# Patient Record
Sex: Male | Born: 1960 | Race: White | Hispanic: No | Marital: Single | Smoking: Former smoker
Health system: Southern US, Community
[De-identification: ages and names within clinical notes are randomized; demographics above are authoritative.]

## PROBLEM LIST (undated history)

## (undated) DIAGNOSIS — F1011 Alcohol abuse, in remission: Secondary | ICD-10-CM

## (undated) DIAGNOSIS — R0602 Shortness of breath: Secondary | ICD-10-CM

## (undated) DIAGNOSIS — K219 Gastro-esophageal reflux disease without esophagitis: Secondary | ICD-10-CM

## (undated) DIAGNOSIS — J90 Pleural effusion, not elsewhere classified: Secondary | ICD-10-CM

## (undated) DIAGNOSIS — R569 Unspecified convulsions: Secondary | ICD-10-CM

## (undated) DIAGNOSIS — K08409 Partial loss of teeth, unspecified cause, unspecified class: Secondary | ICD-10-CM

## (undated) DIAGNOSIS — A491 Streptococcal infection, unspecified site: Secondary | ICD-10-CM

## (undated) DIAGNOSIS — K2970 Gastritis, unspecified, without bleeding: Secondary | ICD-10-CM

## (undated) DIAGNOSIS — R042 Hemoptysis: Secondary | ICD-10-CM

## (undated) DIAGNOSIS — Z9889 Other specified postprocedural states: Secondary | ICD-10-CM

## (undated) DIAGNOSIS — N200 Calculus of kidney: Secondary | ICD-10-CM

## (undated) HISTORY — DX: Other specified postprocedural states: Z98.890

## (undated) HISTORY — DX: Pleural effusion, not elsewhere classified: J90

## (undated) HISTORY — DX: Hemoptysis: R04.2

## (undated) HISTORY — DX: Gastritis, unspecified, without bleeding: K29.70

## (undated) HISTORY — PX: TONSILLECTOMY: SUR1361

---

## 2007-09-09 DIAGNOSIS — K2970 Gastritis, unspecified, without bleeding: Secondary | ICD-10-CM

## 2007-09-09 HISTORY — DX: Gastritis, unspecified, without bleeding: K29.70

## 2013-02-21 ENCOUNTER — Emergency Department (HOSPITAL_COMMUNITY): Payer: Self-pay

## 2013-02-21 ENCOUNTER — Encounter (HOSPITAL_COMMUNITY): Payer: Self-pay | Admitting: *Deleted

## 2013-02-21 ENCOUNTER — Emergency Department (HOSPITAL_COMMUNITY)
Admission: EM | Admit: 2013-02-21 | Discharge: 2013-02-21 | Disposition: A | Discharge status: Home / Self Care | Payer: Self-pay | Attending: Emergency Medicine | Admitting: Emergency Medicine

## 2013-02-21 DIAGNOSIS — M542 Cervicalgia: Secondary | ICD-10-CM | POA: Insufficient documentation

## 2013-02-21 DIAGNOSIS — R05 Cough: Secondary | ICD-10-CM | POA: Insufficient documentation

## 2013-02-21 DIAGNOSIS — R109 Unspecified abdominal pain: Secondary | ICD-10-CM | POA: Insufficient documentation

## 2013-02-21 DIAGNOSIS — Z87891 Personal history of nicotine dependence: Secondary | ICD-10-CM | POA: Insufficient documentation

## 2013-02-21 DIAGNOSIS — J90 Pleural effusion, not elsewhere classified: Secondary | ICD-10-CM | POA: Insufficient documentation

## 2013-02-21 DIAGNOSIS — R059 Cough, unspecified: Secondary | ICD-10-CM | POA: Insufficient documentation

## 2013-02-21 DIAGNOSIS — R079 Chest pain, unspecified: Secondary | ICD-10-CM | POA: Insufficient documentation

## 2013-02-21 DIAGNOSIS — R319 Hematuria, unspecified: Secondary | ICD-10-CM | POA: Insufficient documentation

## 2013-02-21 LAB — TROPONIN I: Troponin I: <0.3 ng/mL (ref <0.30)

## 2013-02-21 LAB — URINALYSIS, ROUTINE W REFLEX MICROSCOPIC
Leukocytes, UA: NEGATIVE
Nitrite: NEGATIVE
pH: 6 (ref 5.0–8.0)

## 2013-02-21 LAB — COMPREHENSIVE METABOLIC PANEL
Alkaline Phosphatase: 90 U/L (ref 39–117)
BUN: 21 mg/dL (ref 6–23)
Calcium: 9.5 mg/dL (ref 8.4–10.5)
Creatinine, Ser: 0.84 mg/dL (ref 0.50–1.35)
GFR calc Af Amer: >90 mL/min (ref >90)
Glucose, Bld: 112 mg/dL — ABNORMAL HIGH (ref 70–99)
Potassium: 5.4 mEq/L — ABNORMAL HIGH (ref 3.5–5.1)
Total Protein: 6.9 g/dL (ref 6.0–8.3)

## 2013-02-21 LAB — CBC
HCT: 31.3 % — ABNORMAL LOW (ref 39.0–52.0)
Hemoglobin: 10.3 g/dL — ABNORMAL LOW (ref 13.0–17.0)
MCH: 28.3 pg (ref 26.0–34.0)
MCHC: 32.9 g/dL (ref 30.0–36.0)
MCV: 86 fL (ref 78.0–100.0)

## 2013-02-21 MED ORDER — LEVOFLOXACIN 500 MG PO TABS: 500.0000 mg | ORAL_TABLET | Freq: Every day | ORAL | Status: DC

## 2013-02-21 MED ORDER — ONDANSETRON HCL 4 MG/2ML IJ SOLN: 4.0000 mg | Freq: Once | INTRAMUSCULAR | Status: DC

## 2013-02-21 MED ORDER — HYDROCODONE-ACETAMINOPHEN 5-325 MG PO TABS
1.0000 | ORAL_TABLET | Freq: Four times a day (QID) | ORAL | Status: DC | PRN
Start: 2013-02-21 — End: 2013-02-28

## 2013-02-21 MED ORDER — IOHEXOL 350 MG/ML SOLN
100.0000 mL | Freq: Once | INTRAVENOUS | Status: AC | PRN
  Administered 2013-02-21: 100 mL via INTRAVENOUS

## 2013-02-21 MED ORDER — IOHEXOL 300 MG/ML  SOLN
50.0000 mL | Freq: Once | INTRAMUSCULAR | Status: AC | PRN
  Administered 2013-02-21: 50 mL via ORAL

## 2013-02-21 MED ORDER — SODIUM CHLORIDE 0.9 % IV BOLUS (SEPSIS)
250.0000 mL | Freq: Once | INTRAVENOUS | Status: AC
  Administered 2013-02-21: 250 mL via INTRAVENOUS

## 2013-02-21 MED ORDER — SODIUM CHLORIDE 0.9 % IV SOLN: INTRAVENOUS | Status: DC

## 2013-02-21 NOTE — ED Notes (Signed)
Left neck pain, radiating to left lower abdomen. Fatigue x 2 weeks.

## 2013-02-21 NOTE — ED Provider Notes (Signed)
History     This chart was scribed for Shelda Jakes, MD, MD by Smitty Pluck, ED Scribe. The patient was seen in room APA03/[facility]03 and the patient's care was started at 1:19 PM.    CSN: [id number]  Arrival date & time 02/21/13  1055      Chief Complaint  Patient presents with  . Neck Pain  . Abdominal Pain  . Fatigue    Patient is a 52 y.o. male presenting with abdominal pain. The history is provided by the patient and medical records. No language interpreter was used.  Abdominal Pain This is a new problem. The current episode started more than 1 week ago. The problem occurs constantly. The problem has not changed since onset.Associated symptoms include abdominal pain. Pertinent negatives include no shortness of breath. The symptoms are aggravated by bending. Nothing relieves the symptoms. He has tried acetaminophen for the symptoms. The treatment provided mild relief.   HPI Comments: Alexander Gray is a 52 y.o. male who presents to the Emergency Department complaining of left lateral chest pain radiating to left abdomen and left alteral neck onset 2 weeks ago. Pt states pain is 7/10 currently and at worse 10/10. He mentions that movement and cough aggravate the pain. He mentions having hematuria once at onset of symptoms. Pt states he has taken tylenol with minor relief. Pt denies fever, chills, nausea, vomiting, diarrhea, weakness, cough, SOB and any other pain.   History reviewed. No pertinent past medical history.  History reviewed. No pertinent past surgical history.  No family history on file.  History  Substance Use Topics  . Smoking status: Former Games developer  . Smokeless tobacco: Not on file  . Alcohol Use: No      Review of Systems  Constitutional: Negative for chills.  HENT: Positive for neck pain. Negative for congestion and rhinorrhea.   Eyes: Negative for visual disturbance.  Respiratory: Positive for cough. Negative for shortness of breath.    Gastrointestinal: Positive for abdominal pain. Negative for nausea, vomiting and diarrhea.  Genitourinary: Positive for hematuria. Negative for dysuria.  Musculoskeletal: Negative for back pain.  Skin: Negative for rash.  Neurological: Negative for syncope.  Hematological: Does not bruise/bleed easily.  Psychiatric/Behavioral: Negative for confusion.  All other systems reviewed and are negative.    Allergies  Review of patient's allergies indicates no known allergies.  Home Medications   Current Outpatient Rx  Name  Route  Sig  Dispense  Refill  . Multiple Vitamin (MULTIVITAMIN WITH MINERALS) TABS   Oral   Take 1 tablet by mouth daily.         . naproxen sodium (ALEVE) 220 MG tablet   Oral   Take 220 mg by mouth 2 (two) times daily as needed (Pain).         Marland Kitchen HYDROcodone-acetaminophen (NORCO/VICODIN) 5-325 MG per tablet   Oral   Take 1-2 tablets by mouth every 6 (six) hours as needed for pain.   20 tablet   0   . levofloxacin (LEVAQUIN) 500 MG tablet   Oral   Take 1 tablet (500 mg total) by mouth daily.   7 tablet   0     BP 127/65  Pulse 93  Temp(Src) 97.6 F (36.4 C) (Oral)  Resp 16  Ht 5\' 10"  (1.778 m)  Wt 190 lb (86.183 kg)  BMI 27.26 kg/m2  SpO2 98%  Physical Exam  Nursing note and vitals reviewed. Constitutional: He is oriented to person, place, and time. He appears  well-developed and well-nourished. No distress.  HENT:  Head: Normocephalic and atraumatic.  Eyes: EOM are normal. Pupils are equal, round, and reactive to light.  Neck: Normal range of motion. Neck supple. No tracheal deviation present.  Cardiovascular: Normal rate, regular rhythm and normal heart sounds.   No murmur heard. Pulses:      Radial pulses are 2+ on the right side.  Pulmonary/Chest: Effort normal and breath sounds normal. No respiratory distress. He has no wheezes. He has no rales. He exhibits no tenderness.  Abdominal: Soft. Bowel sounds are normal. He exhibits no  distension. There is tenderness (LUQ at rib margin). There is no rebound and no guarding.  Musculoskeletal: Normal range of motion.  Neurological: He is alert and oriented to person, place, and time. No cranial nerve deficit.  Skin: Skin is warm and dry.  Psychiatric: He has a normal mood and affect. His behavior is normal.    ED Course  Procedures (including critical care time) DIAGNOSTIC STUDIES: Oxygen Saturation is 98% on room air, normal by my interpretation.    COORDINATION OF CARE: 1:28 PM Discussed ED treatment with pt and pt agrees Chest xray and labs Medications  ondansetron (ZOFRAN) injection 4 mg (not administered)  0.9 %  sodium chloride infusion ( Intravenous Rate/Dose Change 02/21/13 1421)  sodium chloride 0.9 % bolus 250 mL (250 mLs Intravenous New Bag/Given 02/21/13 1421)  iohexol (OMNIPAQUE) 300 MG/ML solution 50 mL (50 mLs Oral Contrast Given 02/21/13 1407)  iohexol (OMNIPAQUE) 350 MG/ML injection 100 mL (100 mLs Intravenous Contrast Given 02/21/13 1436)      Labs Reviewed  CBC - Abnormal; Notable for the following:    WBC 14.4 (*)    RBC 3.64 (*)    Hemoglobin 10.3 (*)    HCT 31.3 (*)    Platelets 478 (*)    All other components within normal limits  COMPREHENSIVE METABOLIC PANEL - Abnormal; Notable for the following:    Potassium 5.4 (*)    Glucose, Bld 112 (*)    Albumin 2.7 (*)    Total Bilirubin 0.2 (*)    All other components within normal limits  URINALYSIS, ROUTINE W REFLEX MICROSCOPIC - Abnormal; Notable for the following:    Ketones, ur TRACE (*)    All other components within normal limits  LIPASE, BLOOD - Abnormal; Notable for the following:    Lipase 10 (*)    All other components within normal limits  D-DIMER, QUANTITATIVE - Abnormal; Notable for the following:    D-Dimer, Quant 2.92 (*)    All other components within normal limits  TROPONIN I   Ct Angio Chest W/cm &/or Wo Cm  02/21/2013   *RADIOLOGY REPORT*  Clinical Data:  Chest pain.   Abdominal pain.  CT ANGIOGRAPHY CHEST WITH CONTRAST  Technique:  Multidetector CT imaging of the chest was performed using the standard protocol during bolus administration of intravenous contrast.  Multiplanar CT image reconstructions including MIPs were obtained to evaluate the vascular anatomy.  Contrast: 50mL OMNIPAQUE IOHEXOL 300 MG/ML  SOLN, OMNIPAQUE IOHEXOL 350 MG/ML SOLN  Comparison:   None.  Findings:  There are no filling defects in the pulmonary arterial tree to suggest acute pulmonary thromboembolism.  There is a large loculated left pleural effusion causing compressive atelectasis have nearly the entire left lung.  The parietal pleura surrounding the left pleural effusion is slightly thickened and enhances posteriorly compatible with complicated pleural fluid.  There is shift of the mediastinum to the right secondary  to mass effect from the loculated pleural effusion.  11 mm short axis diameter right paratracheal node on image 20. Small prevascular nodes.  11 mm short axis left hilar node adjacent to the left mainstem bronchus and inferior on image 47. Other smaller mediastinal nodes are noted.  No obvious endobronchial lesion in the left mainstem bronchus. There is narrowing of the tertiary segment bronchus to the lateral basal segment of the left lower lobe on image 47.  The anterior basal segment tertiary bronchus is difficult to visualize and may be occluded.  There are two areas of ground-glass at the right apex which are nonspecific.  They measure 9 and 10 mm.  Chronic right-sided rib deformity with healing.  No definite acute rib fractures.  No vertebral compression deformity.  Review of the MIP images confirms the above findings.  IMPRESSION: No evidence of acute pulmonary thromboembolism.  Large complicated left pleural effusion exerting mass effect, mediastinum and shift to the right.  Considerations include an inflammatory process such as empyema or malignant pleural effusion.  Left  lower lobe tertiary segment areas are abnormal with areas of narrowing and/or occlusion.  Underlying endobronchial lesion or mucous plugging cannot be excluded.  Bronchoscopy may be helpful.  Abnormal mediastinal adenopathy.  Given the other findings, malignancy or inflammatory process are not excluded.  Nonspecific ground-glass at the right lung apex. Adenocarcinoma cannot be excluded.  Followup by CT is recommended in 12 months, with continued annual surveillance for a minimum of 3 years.  These recommendations are taken from "Recommendations for the Management of Subsolid Pulmonary Nodules Detected at CT:  A Statement from the Fleischner Society" Radiology 2013; 266:1, 304- 317.  CT ABDOMEN AND PELVIS WITH CONTRAST  Technique:  Multidetector CT imaging of the abdomen and pelvis was performed using the standard protocol following bolus administration of intravenous contrast.  Contrast: 50mL OMNIPAQUE IOHEXOL 300 MG/ML  SOLN, OMNIPAQUE IOHEXOL 350 MG/ML SOLN  Comparison:   None.  Findings:  The gallbladder is decompressed.  The liver, spleen, pancreas, adrenal glands, and kidneys are within normal limits.  The larger locular left pleural effusion exerts mass effect upon the hemidiaphragm causing downward displacement.  This could be contributing to left upper quadrant pain.  Moderate stool burden throughout the colon without evidence of obstruction or focal wall thickening.  Normal appendix.  Bladder and prostate are within normal limits.  Mild atherosclerotic changes of the aorta.  No destructive bony process.  IMPRESSION: No acute intra-abdominal pathology.  The large loculated left pleural effusion exerts mass effect upon the left hemidiaphragm with downward displacement.  This could be contributing to left upper quadrant pain.   Original Report Authenticated By: Jolaine Click, M.D.   Ct Abdomen Pelvis W Contrast  02/21/2013   *RADIOLOGY REPORT*  Clinical Data:  Chest pain.  Abdominal pain.  CT  ANGIOGRAPHY CHEST WITH CONTRAST  Technique:  Multidetector CT imaging of the chest was performed using the standard protocol during bolus administration of intravenous contrast.  Multiplanar CT image reconstructions including MIPs were obtained to evaluate the vascular anatomy.  Contrast: 50mL OMNIPAQUE IOHEXOL 300 MG/ML  SOLN, OMNIPAQUE IOHEXOL 350 MG/ML SOLN  Comparison:   None.  Findings:  There are no filling defects in the pulmonary arterial tree to suggest acute pulmonary thromboembolism.  There is a large loculated left pleural effusion causing compressive atelectasis have nearly the entire left lung.  The parietal pleura surrounding the left pleural effusion is slightly thickened and enhances posteriorly compatible with complicated  pleural fluid.  There is shift of the mediastinum to the right secondary to mass effect from the loculated pleural effusion.  11 mm short axis diameter right paratracheal node on image 20. Small prevascular nodes.  11 mm short axis left hilar node adjacent to the left mainstem bronchus and inferior on image 47. Other smaller mediastinal nodes are noted.  No obvious endobronchial lesion in the left mainstem bronchus. There is narrowing of the tertiary segment bronchus to the lateral basal segment of the left lower lobe on image 47.  The anterior basal segment tertiary bronchus is difficult to visualize and may be occluded.  There are two areas of ground-glass at the right apex which are nonspecific.  They measure 9 and 10 mm.  Chronic right-sided rib deformity with healing.  No definite acute rib fractures.  No vertebral compression deformity.  Review of the MIP images confirms the above findings.  IMPRESSION: No evidence of acute pulmonary thromboembolism.  Large complicated left pleural effusion exerting mass effect, mediastinum and shift to the right.  Considerations include an inflammatory process such as empyema or malignant pleural effusion.  Left lower lobe tertiary  segment areas are abnormal with areas of narrowing and/or occlusion.  Underlying endobronchial lesion or mucous plugging cannot be excluded.  Bronchoscopy may be helpful.  Abnormal mediastinal adenopathy.  Given the other findings, malignancy or inflammatory process are not excluded.  Nonspecific ground-glass at the right lung apex. Adenocarcinoma cannot be excluded.  Followup by CT is recommended in 12 months, with continued annual surveillance for a minimum of 3 years.  These recommendations are taken from "Recommendations for the Management of Subsolid Pulmonary Nodules Detected at CT:  A Statement from the Fleischner Society" Radiology 2013; 266:1, 304- 317.  CT ABDOMEN AND PELVIS WITH CONTRAST  Technique:  Multidetector CT imaging of the abdomen and pelvis was performed using the standard protocol following bolus administration of intravenous contrast.  Contrast: 50mL OMNIPAQUE IOHEXOL 300 MG/ML  SOLN, OMNIPAQUE IOHEXOL 350 MG/ML SOLN  Comparison:   None.  Findings:  The gallbladder is decompressed.  The liver, spleen, pancreas, adrenal glands, and kidneys are within normal limits.  The larger locular left pleural effusion exerts mass effect upon the hemidiaphragm causing downward displacement.  This could be contributing to left upper quadrant pain.  Moderate stool burden throughout the colon without evidence of obstruction or focal wall thickening.  Normal appendix.  Bladder and prostate are within normal limits.  Mild atherosclerotic changes of the aorta.  No destructive bony process.  IMPRESSION: No acute intra-abdominal pathology.  The large loculated left pleural effusion exerts mass effect upon the left hemidiaphragm with downward displacement.  This could be contributing to left upper quadrant pain.   Original Report Authenticated By: Jolaine Click, M.D.    Date: 02/21/2013  Rate: 87  Rhythm: normal sinus rhythm  QRS Axis: normal  Intervals: normal  ST/T Wave abnormalities: normal   Conduction Disutrbances:none  Narrative Interpretation:   Old EKG Reviewed: none available  Results for orders placed during the hospital encounter of 02/21/13  CBC      Result Value Range   WBC 14.4 (*) 4.0 - 10.5 K/uL   RBC 3.64 (*) 4.22 - 5.81 MIL/uL   Hemoglobin 10.3 (*) 13.0 - 17.0 g/dL   HCT 54.0 (*) 98.1 - 19.1 %   MCV 86.0  78.0 - 100.0 fL   MCH 28.3  26.0 - 34.0 pg   MCHC 32.9  30.0 - 36.0 g/dL  RDW 14.2  11.5 - 15.5 %   Platelets 478 (*) 150 - 400 K/uL  COMPREHENSIVE METABOLIC PANEL      Result Value Range   Sodium 140  135 - 145 mEq/L   Potassium 5.4 (*) 3.5 - 5.1 mEq/L   Chloride 103  96 - 112 mEq/L   CO2 28  19 - 32 mEq/L   Glucose, Bld 112 (*) 70 - 99 mg/dL   BUN 21  6 - 23 mg/dL   Creatinine, Ser 4.09  0.50 - 1.35 mg/dL   Calcium 9.5  8.4 - 81.1 mg/dL   Total Protein 6.9  6.0 - 8.3 g/dL   Albumin 2.7 (*) 3.5 - 5.2 g/dL   AST 19  0 - 37 U/L   ALT 16  0 - 53 U/L   Alkaline Phosphatase 90  39 - 117 U/L   Total Bilirubin 0.2 (*) 0.3 - 1.2 mg/dL   GFR calc non Af Amer >90  >90 mL/min   GFR calc Af Amer >90  >90 mL/min  URINALYSIS, ROUTINE W REFLEX MICROSCOPIC      Result Value Range   Color, Urine YELLOW  YELLOW   APPearance CLEAR  CLEAR   Specific Gravity, Urine 1.025  1.005 - 1.030   pH 6.0  5.0 - 8.0   Glucose, UA NEGATIVE  NEGATIVE mg/dL   Hgb urine dipstick NEGATIVE  NEGATIVE   Bilirubin Urine NEGATIVE  NEGATIVE   Ketones, ur TRACE (*) NEGATIVE mg/dL   Protein, ur NEGATIVE  NEGATIVE mg/dL   Urobilinogen, UA 0.2  0.0 - 1.0 mg/dL   Nitrite NEGATIVE  NEGATIVE   Leukocytes, UA NEGATIVE  NEGATIVE  LIPASE, BLOOD      Result Value Range   Lipase 10 (*) 11 - 59 U/L  D-DIMER, QUANTITATIVE      Result Value Range   D-Dimer, Quant 2.92 (*) 0.00 - 0.48 ug/mL-FEU  TROPONIN I      Result Value Range   Troponin I <0.30  <0.30 ng/mL      1. Pleural effusion, left       MDM  Symptoms most likely associated with a large left pleural effusion. This could  be malignant in nature could be inflammatory. Patient will need followup with pulmonary medicine is currently from out of town but he would like to the followup here. He'll be in the area for a while no evidence of pulmonary embolism. No evidence of acute cardiac event. Patient will be treated with Levaquin he has not been hospital recently so treat him as if this could be a community-acquired pneumonia he will need to have this tapped and the fluid analyzed and further workup. Patient understands that.      I personally performed the services described in this documentation, which was scribed in my presence. The recorded information has been reviewed and is accurate.     Shelda Jakes, MD 02/21/13 [phone]

## 2013-02-22 ENCOUNTER — Other Ambulatory Visit (HOSPITAL_COMMUNITY): Payer: Self-pay | Admitting: Pulmonary Disease

## 2013-02-23 ENCOUNTER — Ambulatory Visit (HOSPITAL_COMMUNITY)
Admission: RE | Admit: 2013-02-23 | Discharge: 2013-02-23 | Disposition: A | Discharge status: Home / Self Care | Payer: Self-pay | Source: Ambulatory Visit | Attending: Pulmonary Disease | Admitting: Pulmonary Disease

## 2013-02-23 ENCOUNTER — Other Ambulatory Visit (HOSPITAL_COMMUNITY): Payer: Self-pay | Admitting: Pulmonary Disease

## 2013-02-23 DIAGNOSIS — J90 Pleural effusion, not elsewhere classified: Secondary | ICD-10-CM | POA: Insufficient documentation

## 2013-02-23 DIAGNOSIS — Z9889 Other specified postprocedural states: Secondary | ICD-10-CM

## 2013-02-23 DIAGNOSIS — R0602 Shortness of breath: Secondary | ICD-10-CM | POA: Insufficient documentation

## 2013-02-23 HISTORY — DX: Other specified postprocedural states: Z98.890

## 2013-02-23 LAB — BODY FLUID CELL COUNT WITH DIFFERENTIAL
Eos, Fluid: 0 %
Monocyte-Macrophage-Serous Fluid: 1 % — ABNORMAL LOW (ref 50–90)
Neutrophil Count, Fluid: 97 % — ABNORMAL HIGH (ref 0–25)
Total Nucleated Cell Count, Fluid: 77339 cu mm — ABNORMAL HIGH (ref 0–1000)

## 2013-02-23 LAB — PROTEIN, BODY FLUID

## 2013-02-23 NOTE — Progress Notes (Signed)
Lidocaine 1%           6mL injected                        pleural fluid removed

## 2013-02-23 NOTE — Procedures (Signed)
PreOperative Dx: LEFT pleural effusion Postoperative Dx: LEFT pleural effusion Procedure:   US guided LEFT thoracentesis Radiologist:  Tyron Russell Anesthesia:  6.5 ml of 1% lidocaine Specimen:  1550 ml of serosanguinous fluid EBL:   < 1 ml Complications: None

## 2013-02-27 ENCOUNTER — Telehealth (HOSPITAL_COMMUNITY): Payer: Self-pay | Admitting: Emergency Medicine

## 2013-02-27 LAB — BODY FLUID CULTURE

## 2013-02-27 NOTE — ED Notes (Signed)
Chart returned from EDP office. Per Joni Reining Pisciotta PA-C, c/w Levaquin. Return to ED ASAP if any new or worsening symptom or if symptoms persist > 5 days.

## 2013-02-28 ENCOUNTER — Institutional Professional Consult (permissible substitution) (INDEPENDENT_AMBULATORY_CARE_PROVIDER_SITE_OTHER): Payer: Self-pay | Admitting: Cardiothoracic Surgery

## 2013-02-28 ENCOUNTER — Encounter (HOSPITAL_COMMUNITY): Payer: Self-pay | Admitting: Pharmacy Technician

## 2013-02-28 ENCOUNTER — Encounter (HOSPITAL_COMMUNITY): Payer: Self-pay | Admitting: *Deleted

## 2013-02-28 ENCOUNTER — Other Ambulatory Visit: Payer: Self-pay | Admitting: *Deleted

## 2013-02-28 ENCOUNTER — Encounter: Payer: Self-pay | Admitting: *Deleted

## 2013-02-28 VITALS — BP 121/71 | HR 98 | Temp 97.3°F | Resp 16 | Ht 70.0 in | Wt 203.0 lb

## 2013-02-28 DIAGNOSIS — J869 Pyothorax without fistula: Secondary | ICD-10-CM

## 2013-02-28 DIAGNOSIS — Z9889 Other specified postprocedural states: Secondary | ICD-10-CM | POA: Insufficient documentation

## 2013-02-28 DIAGNOSIS — J90 Pleural effusion, not elsewhere classified: Secondary | ICD-10-CM

## 2013-02-28 DIAGNOSIS — K2970 Gastritis, unspecified, without bleeding: Secondary | ICD-10-CM | POA: Insufficient documentation

## 2013-02-28 NOTE — Patient Instructions (Signed)
Thoracoscopy Thoracoscopy is a procedure in which a thin, lighted tube (thoracoscope) is put through a small cut (incision) in the chest wall. This procedure makes it possible for your caregiver to look at the lungs or other structures in the chest cavity and to do some minor operations. This is a more minor procedure than thoracotomy, which opens the chest cavity with a large incision. Thoracoscopy can sometimes be used instead of thoracotomy. Thoracoscopy usually involves less pain, a shorter hospital stay, and a shorter recovery time. Common reasons for this procedure are:  To study diseases or problems in the chest.  To take a tissue sample (biopsy) to study under a microscope.  To put medicines directly into the lungs.  To remove collections of fluid, pus (empyema), or blood in the chest. LET YOUR CAREGIVER KNOW ABOUT:   Allergies to food or medicine.  Medicines taken, including vitamins, herbs, eyedrops, over-the-counter medicines, and creams.  Use of steroids (by mouth or creams).  Previous problems with anesthetics or numbing medicines.  History of bleeding problems or blood clots.  Previous surgery.  Any history of heart problems.  Other health problems, including diabetes and kidney problems.  Possibility of pregnancy, if this applies. RISKS AND COMPLICATIONS   If too much bleeding occurs, or if the surgery turns out to be major, it may be necessary to open the chest (thoracotomy) to control the bleeding.  There is a risk of injury to nerves or other structures in the chest as a result of the placement of the instruments.  When the chest tube is removed, the lung may collapse (pneumothorax). If this happens, the tube may need to be reinserted and left in place until a time when the lung will remain expanded as the tube is removed. BEFORE THE PROCEDURE   You may have routine tests done, such as blood tests, urine tests, and chest X-rays.  Electrocardiography (EKG) to  record the electrical activity of the heart may be done to make sure the heart is okay.  Do not eat or drink after midnight the night before the procedure. Medicine given before the procedure that makes you sleep (general anesthetic) may cause vomiting. A patient who vomits is in danger of inhaling food into the lungs. This can cause serious complications and can be life-threatening. PROCEDURE  Video-assisted thoracic surgery (VATS) is surgery using a thoracoscope with a small video camera on the end. The picture from inside the chest is displayed on a television screen for the surgeon to see. The lung being worked on is collapsed and several small incisions are made to insert instruments. The surgeon can manipulate the instruments while watching on the television screen. The thoracoscope may be removed and put into different areas as needed. When the procedure is finished, the surgeon expands the lung and puts one or more chest tubes in the chest. The chest tubes allow the lung to expand and allow fluid (drainage) to come out. The remaining incisions are closed with stitches (sutures) or staples. AFTER THE PROCEDURE   The chest tube is left in place for 1 to several days to drain fluid or air from the chest cavity.  Hospital stays range from 1 to 5 days depending on the procedure and treatment. Document Released: 05/24/2003 Document Revised: 11/17/2011 Document Reviewed: 02/12/2011 [facility] Patient Information 2014 [place], [place]. Thoracoscopy Care After Refer to this sheet in the next few weeks. These discharge instructions provide you with general information on caring for yourself after you leave the  hospital. Your caregiver may also give you specific instructions. Your treatment has been planned according to the most current medical practices available, but unavoidable complications sometimes occur. If you have any problems or questions after discharge, call your caregiver. HOME CARE  INSTRUCTIONS   Remove the bandage (dressing) over your chest tube site as directed by your caregiver.  It is normal to be sore for a couple weeks following surgery. See your caregiver if this seems to be getting worse rather than better.  Only take over-the-counter or prescription medicines for pain, discomfort, or fever as directed by your caregiver. It is very important to take pain medicine when you need it so that you will cough and breathe deeply enough to clear mucus (phlegm) and expand your lungs.  If it hurts to cough, hold a pillow against your chest when you cough. This may help with the discomfort. In spite of the discomfort, cough frequently, as this helps protect against getting an infection in your lung (pneumonia).  Taking deep breaths keeps lungs inflated and protects against pneumonia. Most patients will go home with an incentive spirometer that encourages deep breathing.  You may resume a normal diet and activities as directed.  Use showers for bathing until you see your caregiver, or as instructed.  Change dressings if necessary or as directed.  Avoid lifting or driving until you are instructed otherwise.  Make an appointment to see your caregiver for stitch (suture) or staple removal when instructed.  Do not travel by airplane for 2 weeks after the chest tube is removed. SEEK MEDICAL CARE IF:   You are bleeding from your wounds.  You have redness, swelling, or increasing pain in the wounds.  Your heartbeat feels irregular or very fast.  There is pus coming from your wounds.  There is a bad smell coming from the wound or dressing. SEEK IMMEDIATE MEDICAL CARE IF:   You have a fever.  You develop a rash.  You have difficulty breathing.  You develop any reaction or side effects to medicines given.  You develop lightheadedness or feel faint.  You develop shortness of breath or chest pain. MAKE SURE YOU:   Understand these instructions.  Will watch  your condition.  Will get help right away if you are not doing well or get worse. Document Released: 03/14/2005 Document Revised: 11/17/2011 Document Reviewed: 02/12/2011 [facility] Patient Information 2014 [place], [place].

## 2013-02-28 NOTE — Progress Notes (Signed)
Pt advised to Stop taking Aspirin, Coumadin, Plavix, Effient, and herbal medicationOmega-3 Fatty Acids (FISH OIL) 1200 MG CAPS s . Do not take any NSAIDs ie: Ibuprofen, Advil, Naproxen or any medication containing Aspirin.

## 2013-02-28 NOTE — Progress Notes (Signed)
[address]       [place] [address]             [phone]                    [name] [facility] Medical Record #[id number] Date of Birth: 1960-11-15  Referring: Fredirick Maudlin, MD Primary Care: Fredirick Maudlin, MD  Chief Complaint:    Chief Complaint  Patient presents with  . Pleural Effusion    left/empyema......Alexander Gray and treat    History of Present Illness:    Patient is a 52 year old male with previous long smoking history who recently presented at [facility] emergency room with a three-week history of "pinched nerve" involving his left neck and chest and left flank. He was taking up to 10 Aleve a day for the pain. On June 16 he went to the [facility] emergency room CT scan of the chest showed a large left complex pleural effusion. He was started on Levaquin, a thoracentesis was done draining approximately 1500 cc of fluid, Dr. Juanetta Gosling reports today that this fluid grew strep viridans. The patient denies any fever or chills. He has noted increasing weakness over the past week and increasing pedal edema. He has been a long-term smoker starting as a teenager. He quit smoking in February 2014. He notes a previous history of heavy alcohol use but none for the past year.  He's had no hemoptysis. He denies any exposure to tuberculosis that he is aware of. He denies working with asbestos.  He recently came here from [place] where he lives to visit with his parents.  Current Activity/ Functional Status:  Patient is independent with mobility/ambulation, transfers, ADL's, IADL's.  Zubrod Score: At the time of surgery this patient's most appropriate activity status/level should be described as: []  Normal activity, no symptoms [x]  Symptoms, fully ambulatory []  Symptoms, in bed less than or equal to 50% of the time []  Symptoms, in bed greater than 50% of the time but less than 100% []  Bedridden []  Moribund   Past Medical History  Diagnosis Date    . Hemoptysis     2009  . Pleural effusion, left     02/28/13  . S/P thoracentesis 02/23/13    left  . Gastritis 2009    vomiting blood    Past Surgical History  Procedure Laterality Date  . Tonsillectomy      Family history: Patient denies any family members with tuberculosis his father is alive and here in the office today with a history of hypertension hyperlipidemia his mother is also here has a history of hypertension he has 2 brothers who are healthy  History   Social History  . Marital Status: Single    Spouse Name: N/A    Number of Children: N/A  . Years of Education: N/A   Occupational History  .  currently unemployed    Social History Main Topics  . Smoking status: Former Smoker -- 1.00 packs/day for 35 years    Types: Cigarettes    Quit date: 09/30/2012  . Smokeless tobacco: Never Used     Comment: vapor for 6 months  . Alcohol Use: No  . Drug Use: Not on file  . Sexually Active: Not on file    Social History Narrative  . No narrative on file    History  Smoking status  . Former Smoker -- 1.00 packs/day for 35 years  . Types: Cigarettes  .  Quit date: 09/30/2012  Smokeless tobacco  . Never Used    Comment: vapor for 6 months    History  Alcohol Use No     No Known Allergies  Current Outpatient Prescriptions  Medication Sig Dispense Refill  . HYDROcodone-acetaminophen (NORCO/VICODIN) 5-325 MG per tablet Take 1-2 tablets by mouth every 6 (six) hours as needed for pain.  20 tablet  0  . levofloxacin (LEVAQUIN) 500 MG tablet Take 1 tablet (500 mg total) by mouth daily.  7 tablet  0  . Multiple Vitamin (MULTIVITAMIN WITH MINERALS) TABS Take 1 tablet by mouth daily.      . naproxen sodium (ALEVE) 220 MG tablet Take 220 mg by mouth 2 (two) times daily as needed (Pain).       No current facility-administered medications for this visit.       Review of Systems:     Cardiac Review of Systems: Y or N  Chest Pain [ y   ]  Resting SOB [ y   ] Exertional SOB  Cove.Etienne  ]  Orthopnea Cove.Etienne  ]   Pedal Edema Cove.Etienne   ]    Palpitations [ n ] Syncope  [ n ]   Presyncope [ n  ]  General Review of Systems: [Y] = yes [  ]=no Constitional: recent weight change [ y ]; anorexia [  ]; fatigue [  ]; nausea [  ]; night sweats [n  ]; fever [ n ]; or chills [ n ];                                                                                                                                          Dental: poor dentition[ y ]; Last Dentist visit:   Eye : blurred vision [  ]; diplopia [   ]; vision changes [  ];  Amaurosis fugax[  ]; Resp: cough [  ];  wheezing[  ];  hemoptysis[n  ]; shortness of breath[y  ]; paroxysmal nocturnal dyspnea[y  ]; dyspnea on exertion[ y ]; or orthopnea[y  ];  GI:  gallstones[  ], vomiting[ n ];  dysphagia[  ]; melena[  ];  hematochezia [  ]; heartburn[  ];   Hx of  Colonoscopy[  ]; GU: kidney stones [  ]; hematuria[  ];   dysuria Cove.Etienne  ];  nocturia[  ];  history of     obstruction [  ]; urinary frequency Cove.Etienne  ]             Skin: rash, swelling[  ];, hair loss[  ];  peripheral edema[  ];  or itching[  ]; Musculosketetal: myalgias[  ];  joint swelling[  ];  joint erythema[  ];  joint pain[  ];  back pain[  ];  Heme/Lymph: bruising[  ];  bleeding[  ];  anemia[  ];  Neuro: TIA[  ];  headaches[  ];  stroke[  ];  vertigo[  ];  seizures[  ];   paresthesias[  ];  difficulty walking[  ];  Psych:depression[  ]; anxiety[ y ];  Endocrine: diabetes[n  ];  thyroid dysfunction[n  ];  Immunizations: Flu Milo.Brash  ]; Pneumococcal[  n];  Other:  Physical Exam: BP 121/71  Pulse 98  Temp(Src) 97.3 F (36.3 C) (Oral)  Resp 16  Ht 5\' 10"  (1.778 m)  Wt 203 lb (92.08 kg)  BMI 29.13 kg/m2  SpO2 98%  General appearance: alert, cooperative, appears stated age and no distress Neurologic: intact Heart: regular rate and rhythm, S1, S2 normal, no murmur, click, rub or gallop and normal apical impulse Lungs: diminished breath sounds LLL and LUL Abdomen: soft,  non-tender; bowel sounds normal; no masses,  no organomegaly Extremities: edema bilaterial ankle edema and Homans sign is negative, no sign of DVT no carotid bruits   Diagnostic Studies & Laboratory data:     Recent Radiology Findings:  Dg Chest 1 View  02/23/2013   *RADIOLOGY REPORT*  Clinical Data: Left pleural effusion post thoracentesis with removal of 1550 ml of serosanguineous fluid from the left hemithorax  CHEST - 1 VIEW  Comparison: Chest CT 02/21/2013  Findings: Minimal mediastinal shift left to right. Persistent moderate sized left pleural effusion despite removal of greater than 1.5 liters of fluid from the left chest. Persistent atelectasis of left lower lobe. Right lung clear. No pneumothorax. No acute osseous findings. Old healed fracture posterolateral right 6th rib.  IMPRESSION: No pneumothorax following left thoracentesis. Persistent moderate left pleural effusion and significant left lower lobe atelectasis despite removal of 1550 ml of serosanguineous fluid from the left chest.  No shortness of breath following thoracentesis. Patient states he feels symptomatically improved. Discussed with the patient the need for deep breathing and coughing to help re-expand the atelectatic left lower lobe.   Original Report Authenticated By: Ulyses Southward, M.D.   Ct Angio Chest W/cm &/or Wo Cm  02/21/2013   *RADIOLOGY REPORT*  Clinical Data:  Chest pain.  Abdominal pain.  CT ANGIOGRAPHY CHEST WITH CONTRAST  Technique:  Multidetector CT imaging of the chest was performed using the standard protocol during bolus administration of intravenous contrast.  Multiplanar CT image reconstructions including MIPs were obtained to evaluate the vascular anatomy.  Contrast: 50mL OMNIPAQUE IOHEXOL 300 MG/ML  SOLN, OMNIPAQUE IOHEXOL 350 MG/ML SOLN  Comparison:   None.  Findings:  There are no filling defects in the pulmonary arterial tree to suggest acute pulmonary thromboembolism.  There is a large loculated left  pleural effusion causing compressive atelectasis have nearly the entire left lung.  The parietal pleura surrounding the left pleural effusion is slightly thickened and enhances posteriorly compatible with complicated pleural fluid.  There is shift of the mediastinum to the right secondary to mass effect from the loculated pleural effusion.  11 mm short axis diameter right paratracheal node on image 20. Small prevascular nodes.  11 mm short axis left hilar node adjacent to the left mainstem bronchus and inferior on image 47. Other smaller mediastinal nodes are noted.  No obvious endobronchial lesion in the left mainstem bronchus. There is narrowing of the tertiary segment bronchus to the lateral basal segment of the left lower lobe on image 47.  The anterior basal segment tertiary bronchus is difficult to visualize and may be occluded.  There are two areas of ground-glass at the right apex which are nonspecific.  They measure 9 and 10 mm.  Chronic right-sided rib deformity with healing.  No definite acute rib fractures.  No vertebral compression deformity.  Review of the MIP images confirms the above findings.  IMPRESSION: No evidence of acute pulmonary thromboembolism.  Large complicated left pleural effusion exerting mass effect, mediastinum and shift to the right.  Considerations include an inflammatory process such as empyema or malignant pleural effusion.  Left lower lobe tertiary segment areas are abnormal with areas of narrowing and/or occlusion.  Underlying endobronchial lesion or mucous plugging cannot be excluded.  Bronchoscopy may be helpful.  Abnormal mediastinal adenopathy.  Given the other findings, malignancy or inflammatory process are not excluded.  Nonspecific ground-glass at the right lung apex. Adenocarcinoma cannot be excluded.  Followup by CT is recommended in 12 months, with continued annual surveillance for a minimum of 3 years.  These recommendations are taken from "Recommendations for the  Management of Subsolid Pulmonary Nodules Detected at CT:  A Statement from the Fleischner Society" Radiology 2013; 266:1, 304- 317.  CT ABDOMEN AND PELVIS WITH CONTRAST  Technique:  Multidetector CT imaging of the abdomen and pelvis was performed using the standard protocol following bolus administration of intravenous contrast.  Contrast: 50mL OMNIPAQUE IOHEXOL 300 MG/ML  SOLN, OMNIPAQUE IOHEXOL 350 MG/ML SOLN  Comparison:   None.  Findings:  The gallbladder is decompressed.  The liver, spleen, pancreas, adrenal glands, and kidneys are within normal limits.  The larger locular left pleural effusion exerts mass effect upon the hemidiaphragm causing downward displacement.  This could be contributing to left upper quadrant pain.  Moderate stool burden throughout the colon without evidence of obstruction or focal wall thickening.  Normal appendix.  Bladder and prostate are within normal limits.  Mild atherosclerotic changes of the aorta.  No destructive bony process.  IMPRESSION: No acute intra-abdominal pathology.  The large loculated left pleural effusion exerts mass effect upon the left hemidiaphragm with downward displacement.  This could be contributing to left upper quadrant pain.   Original Report Authenticated By: Jolaine Click, M.D.   Ct Abdomen Pelvis W Contrast  02/21/2013   *RADIOLOGY REPORT*  Clinical Data:  Chest pain.  Abdominal pain.  CT ANGIOGRAPHY CHEST WITH CONTRAST  Technique:  Multidetector CT imaging of the chest was performed using the standard protocol during bolus administration of intravenous contrast.  Multiplanar CT image reconstructions including MIPs were obtained to evaluate the vascular anatomy.  Contrast: 50mL OMNIPAQUE IOHEXOL 300 MG/ML  SOLN, OMNIPAQUE IOHEXOL 350 MG/ML SOLN  Comparison:   None.  Findings:  There are no filling defects in the pulmonary arterial tree to suggest acute pulmonary thromboembolism.  There is a large loculated left pleural effusion causing  compressive atelectasis have nearly the entire left lung.  The parietal pleura surrounding the left pleural effusion is slightly thickened and enhances posteriorly compatible with complicated pleural fluid.  There is shift of the mediastinum to the right secondary to mass effect from the loculated pleural effusion.  11 mm short axis diameter right paratracheal node on image 20. Small prevascular nodes.  11 mm short axis left hilar node adjacent to the left mainstem bronchus and inferior on image 47. Other smaller mediastinal nodes are noted.  No obvious endobronchial lesion in the left mainstem bronchus. There is narrowing of the tertiary segment bronchus to the lateral basal segment of the left lower lobe on image 47.  The anterior basal segment tertiary bronchus is difficult to visualize and may be occluded.  There are two areas of ground-glass at the  right apex which are nonspecific.  They measure 9 and 10 mm.  Chronic right-sided rib deformity with healing.  No definite acute rib fractures.  No vertebral compression deformity.  Review of the MIP images confirms the above findings.  IMPRESSION: No evidence of acute pulmonary thromboembolism.  Large complicated left pleural effusion exerting mass effect, mediastinum and shift to the right.  Considerations include an inflammatory process such as empyema or malignant pleural effusion.  Left lower lobe tertiary segment areas are abnormal with areas of narrowing and/or occlusion.  Underlying endobronchial lesion or mucous plugging cannot be excluded.  Bronchoscopy may be helpful.  Abnormal mediastinal adenopathy.  Given the other findings, malignancy or inflammatory process are not excluded.  Nonspecific ground-glass at the right lung apex. Adenocarcinoma cannot be excluded.  Followup by CT is recommended in 12 months, with continued annual surveillance for a minimum of 3 years.  These recommendations are taken from "Recommendations for the Management of Subsolid  Pulmonary Nodules Detected at CT:  A Statement from the Fleischner Society" Radiology 2013; 266:1, 304- 317.  CT ABDOMEN AND PELVIS WITH CONTRAST  Technique:  Multidetector CT imaging of the abdomen and pelvis was performed using the standard protocol following bolus administration of intravenous contrast.  Contrast: 50mL OMNIPAQUE IOHEXOL 300 MG/ML  SOLN, OMNIPAQUE IOHEXOL 350 MG/ML SOLN  Comparison:   None.  Findings:  The gallbladder is decompressed.  The liver, spleen, pancreas, adrenal glands, and kidneys are within normal limits.  The larger locular left pleural effusion exerts mass effect upon the hemidiaphragm causing downward displacement.  This could be contributing to left upper quadrant pain.  Moderate stool burden throughout the colon without evidence of obstruction or focal wall thickening.  Normal appendix.  Bladder and prostate are within normal limits.  Mild atherosclerotic changes of the aorta.  No destructive bony process.  IMPRESSION: No acute intra-abdominal pathology.  The large loculated left pleural effusion exerts mass effect upon the left hemidiaphragm with downward displacement.  This could be contributing to left upper quadrant pain.   Original Report Authenticated By: Jolaine Click, M.D.   US Thoracentesis Asp Pleural Space W/img Guide  02/23/2013   *RADIOLOGY REPORT*  CLINICAL DATA: Large left pleural effusion, shortness of breath  ULTRASOUND GUIDED LEFT THORACENTESIS:  Technique: After explanation of procedure and risks, written informed consent for thoracentesis obtained. Time out protocol followed. Left pleural effusion localized by ultrasound. Site at posterior leftchest prepped and draped in usual sterile fashion. Skin and chest wall anesthetized with 6.5 ml of 1% lidocaine. Standard 8-French thoracentesis catheter placed into left pleural space. Poor fluid flow was encountered. Catheter was removed and a 5-French Yueh needle was placed into left hemithorax at the same site.  Good flow of fluid was encountered. 1550 ml of serosanguineous fluid aspirated by syringe pump. Procedure tolerated well by patient without immediate complication.  IMPRESSION: Ultrasound-guided left thoracentesis with removal of 1550 ml of left pleural fluid. Fluid sent to laboratory for requested analysis.   Original Report Authenticated By: Ulyses Southward, M.D.    Recent Lab Findings: Lab Results  Component Value Date   WBC 14.4* 02/21/2013   HGB 10.3* 02/21/2013   HCT 31.3* 02/21/2013   PLT 478* 02/21/2013   GLUCOSE 112* 02/21/2013   ALT 16 02/21/2013   AST 19 02/21/2013   NA 140 02/21/2013   K 5.4* 02/21/2013   CL 103 02/21/2013   CREATININE 0.84 02/21/2013   BUN 21 02/21/2013   CO2 28 02/21/2013   MICRO:  of pleural fluid  ABUNDANT VIRIDANS STREPTOCOCCUS no AFB on smear  PLEURAL FLUID, LEFT: REACTIVE MESOTHELIAL CELLS PRESENT. ACUTE INFLAMMATION.   Assessment / Plan:      Patient with large left loculated effusion, now growing  ABUNDANT VIRIDANS STREPTOCOCCUS. Cytology is negative for malignancy on thoracentesis. Because of the incomplete drainage a thoracentesis alone I recommended to the patient that we proceed tomorrow with bronchoscopy left video-assisted thoracoscopy and drainage of empyema. The risks and options have been discussed with the patient and his parents in detail. His questions have been answered and he is willing to proceed.    Delight Ovens MD      [address] [place] [place] [address] Office [phone]   Beeper [phone]  02/28/2013 4:29 PM

## 2013-03-01 ENCOUNTER — Inpatient Hospital Stay (HOSPITAL_COMMUNITY): Payer: Self-pay

## 2013-03-01 ENCOUNTER — Ambulatory Visit (HOSPITAL_COMMUNITY): Payer: Self-pay

## 2013-03-01 ENCOUNTER — Encounter (HOSPITAL_COMMUNITY): Payer: Self-pay | Admitting: Certified Registered"

## 2013-03-01 ENCOUNTER — Ambulatory Visit (HOSPITAL_COMMUNITY): Payer: Self-pay | Admitting: Certified Registered"

## 2013-03-01 ENCOUNTER — Inpatient Hospital Stay (HOSPITAL_COMMUNITY)
Admission: RE | Admit: 2013-03-01 | Discharge: 2013-03-07 | DRG: 165 | Disposition: A | Discharge status: Home / Self Care | Payer: MEDICAID | Source: Ambulatory Visit | Attending: Cardiothoracic Surgery | Admitting: Cardiothoracic Surgery

## 2013-03-01 ENCOUNTER — Encounter (HOSPITAL_COMMUNITY)
Admission: RE | Disposition: A | Discharge status: Home / Self Care | Payer: Self-pay | Source: Ambulatory Visit | Attending: Cardiothoracic Surgery

## 2013-03-01 ENCOUNTER — Encounter (HOSPITAL_COMMUNITY): Payer: Self-pay

## 2013-03-01 DIAGNOSIS — F411 Generalized anxiety disorder: Secondary | ICD-10-CM | POA: Diagnosis present

## 2013-03-01 DIAGNOSIS — B954 Other streptococcus as the cause of diseases classified elsewhere: Secondary | ICD-10-CM | POA: Diagnosis present

## 2013-03-01 DIAGNOSIS — J869 Pyothorax without fistula: Secondary | ICD-10-CM

## 2013-03-01 DIAGNOSIS — J438 Other emphysema: Secondary | ICD-10-CM | POA: Diagnosis present

## 2013-03-01 DIAGNOSIS — F1010 Alcohol abuse, uncomplicated: Secondary | ICD-10-CM | POA: Diagnosis present

## 2013-03-01 DIAGNOSIS — K219 Gastro-esophageal reflux disease without esophagitis: Secondary | ICD-10-CM | POA: Diagnosis present

## 2013-03-01 DIAGNOSIS — Z87891 Personal history of nicotine dependence: Secondary | ICD-10-CM

## 2013-03-01 HISTORY — PX: VIDEO BRONCHOSCOPY: SHX5072

## 2013-03-01 HISTORY — DX: Gastro-esophageal reflux disease without esophagitis: K21.9

## 2013-03-01 HISTORY — PX: VIDEO ASSISTED THORACOSCOPY (VATS)/EMPYEMA: SHX6172

## 2013-03-01 HISTORY — DX: Calculus of kidney: N20.0

## 2013-03-01 HISTORY — DX: Shortness of breath: R06.02

## 2013-03-01 HISTORY — DX: Unspecified convulsions: R56.9

## 2013-03-01 LAB — APTT: aPTT: 43 seconds — ABNORMAL HIGH (ref 24–37)

## 2013-03-01 LAB — BODY FLUID CELL COUNT WITH DIFFERENTIAL
Lymphs, Fluid: 3 %
Monocyte-Macrophage-Serous Fluid: 2 % — ABNORMAL LOW (ref 50–90)
Neutrophil Count, Fluid: 95 % — ABNORMAL HIGH (ref 0–25)
Total Nucleated Cell Count, Fluid: 22300 cu mm — ABNORMAL HIGH (ref 0–1000)

## 2013-03-01 LAB — URINALYSIS, ROUTINE W REFLEX MICROSCOPIC
Bilirubin Urine: NEGATIVE
Glucose, UA: NEGATIVE mg/dL
Hgb urine dipstick: NEGATIVE
Ketones, ur: NEGATIVE mg/dL
Leukocytes, UA: NEGATIVE
Nitrite: NEGATIVE
Protein, ur: NEGATIVE mg/dL
Specific Gravity, Urine: 1.042 — ABNORMAL HIGH (ref 1.005–1.030)
Urobilinogen, UA: 0.2 mg/dL (ref 0.0–1.0)
pH: 5.5 (ref 5.0–8.0)

## 2013-03-01 LAB — COMPREHENSIVE METABOLIC PANEL
ALT: 13 U/L (ref 0–53)
AST: 19 U/L (ref 0–37)
Albumin: 1.9 g/dL — ABNORMAL LOW (ref 3.5–5.2)
Alkaline Phosphatase: 92 U/L (ref 39–117)
BUN: 14 mg/dL (ref 6–23)
CO2: 25 mEq/L (ref 19–32)
Calcium: 8.7 mg/dL (ref 8.4–10.5)
Chloride: 97 mEq/L (ref 96–112)
Creatinine, Ser: 0.68 mg/dL (ref 0.50–1.35)
GFR calc Af Amer: >90 mL/min (ref >90)
GFR calc non Af Amer: >90 mL/min (ref >90)
Glucose, Bld: 91 mg/dL (ref 70–99)
Potassium: 4.4 mEq/L (ref 3.5–5.1)
Sodium: 133 mEq/L — ABNORMAL LOW (ref 135–145)
Total Bilirubin: 0.2 mg/dL — ABNORMAL LOW (ref 0.3–1.2)
Total Protein: 6.3 g/dL (ref 6.0–8.3)

## 2013-03-01 LAB — BLOOD GAS, ARTERIAL
Acid-Base Excess: 3.3 mmol/L — ABNORMAL HIGH (ref 0.0–2.0)
Bicarbonate: 27.6 mEq/L — ABNORMAL HIGH (ref 20.0–24.0)
Drawn by: 206361
FIO2: 0.21 %
O2 Saturation: 90.5 %
Patient temperature: 98.6
TCO2: 28.9 mmol/L (ref 0–100)
pCO2 arterial: 44.4 mmHg (ref 35.0–45.0)
pH, Arterial: 7.41 (ref 7.350–7.450)
pO2, Arterial: 59 mmHg — ABNORMAL LOW (ref 80.0–100.0)

## 2013-03-01 LAB — PROTEIN, BODY FLUID: Total protein, fluid: 4.7 g/dL

## 2013-03-01 LAB — CBC
HCT: 28.3 % — ABNORMAL LOW (ref 39.0–52.0)
Hemoglobin: 9.5 g/dL — ABNORMAL LOW (ref 13.0–17.0)
MCH: 27.6 pg (ref 26.0–34.0)
MCHC: 33.6 g/dL (ref 30.0–36.0)
MCV: 82.3 fL (ref 78.0–100.0)
Platelets: 583 10*3/uL — ABNORMAL HIGH (ref 150–400)
RBC: 3.44 MIL/uL — ABNORMAL LOW (ref 4.22–5.81)
RDW: 14.6 % (ref 11.5–15.5)
WBC: 18.7 10*3/uL — ABNORMAL HIGH (ref 4.0–10.5)

## 2013-03-01 LAB — TYPE AND SCREEN
ABO/RH(D): O POS
Antibody Screen: NEGATIVE

## 2013-03-01 LAB — SURGICAL PCR SCREEN
MRSA, PCR: NEGATIVE
Staphylococcus aureus: NEGATIVE

## 2013-03-01 LAB — ABO/RH: ABO/RH(D): O POS

## 2013-03-01 LAB — LACTATE DEHYDROGENASE, PLEURAL OR PERITONEAL FLUID: LD, Fluid: 6748 U/L — ABNORMAL HIGH (ref 3–23)

## 2013-03-01 LAB — GLUCOSE, SEROUS FLUID: Glucose, Fluid: <20 mg/dL

## 2013-03-01 LAB — PROTIME-INR
INR: 1.1 (ref 0.00–1.49)
Prothrombin Time: 14.1 seconds (ref 11.6–15.2)

## 2013-03-01 SURGERY — BRONCHOSCOPY, VIDEO-ASSISTED
Anesthesia: General | Site: Chest | Wound class: Clean Contaminated

## 2013-03-01 MED ORDER — NEOSTIGMINE METHYLSULFATE 1 MG/ML IJ SOLN
INTRAMUSCULAR | Status: DC | PRN
  Administered 2013-03-01: 3 mg via INTRAVENOUS

## 2013-03-01 MED ORDER — DIPHENHYDRAMINE HCL 12.5 MG/5ML PO ELIX
12.5000 mg | ORAL_SOLUTION | Freq: Four times a day (QID) | ORAL | Status: DC | PRN
  Filled 2013-03-01: qty 5

## 2013-03-01 MED ORDER — ACETAMINOPHEN 10 MG/ML IV SOLN
1000.0000 mg | Freq: Four times a day (QID) | INTRAVENOUS | Status: AC
  Administered 2013-03-01 – 2013-03-02 (×4): 1000 mg via INTRAVENOUS
  Filled 2013-03-01 (×4): qty 100

## 2013-03-01 MED ORDER — ONDANSETRON HCL 4 MG/2ML IJ SOLN
4.0000 mg | Freq: Four times a day (QID) | INTRAMUSCULAR | Status: DC | PRN
  Filled 2013-03-01: qty 2

## 2013-03-01 MED ORDER — MIDAZOLAM HCL 5 MG/5ML IJ SOLN
INTRAMUSCULAR | Status: DC | PRN
  Administered 2013-03-01: 1 mg via INTRAVENOUS

## 2013-03-01 MED ORDER — DEXTROSE-NACL 5-0.45 % IV SOLN
INTRAVENOUS | Status: DC
  Administered 2013-03-01: 21:00:00 via INTRAVENOUS
  Administered 2013-03-03: 20 mL/h via INTRAVENOUS
  Administered 2013-03-03 – 2013-03-05 (×2): via INTRAVENOUS

## 2013-03-01 MED ORDER — VANCOMYCIN HCL IN DEXTROSE 1-5 GM/200ML-% IV SOLN
1000.0000 mg | Freq: Three times a day (TID) | INTRAVENOUS | Status: DC
  Administered 2013-03-01 – 2013-03-02 (×2): 1000 mg via INTRAVENOUS
  Filled 2013-03-01 (×6): qty 200

## 2013-03-01 MED ORDER — FENTANYL CITRATE 0.05 MG/ML IJ SOLN
INTRAMUSCULAR | Status: DC | PRN
  Administered 2013-03-01 (×2): 50 ug via INTRAVENOUS
  Administered 2013-03-01: 100 ug via INTRAVENOUS
  Administered 2013-03-01 (×5): 50 ug via INTRAVENOUS

## 2013-03-01 MED ORDER — OXYCODONE HCL 5 MG PO TABS
5.0000 mg | ORAL_TABLET | ORAL | Status: AC | PRN
  Administered 2013-03-01 (×2): 5 mg via ORAL
  Administered 2013-03-02: 10 mg via ORAL
  Filled 2013-03-01 (×2): qty 1
  Filled 2013-03-01: qty 2

## 2013-03-01 MED ORDER — VITAMIN C 500 MG PO TABS
500.0000 mg | ORAL_TABLET | Freq: Every day | ORAL | Status: DC
  Administered 2013-03-02 – 2013-03-07 (×6): 500 mg via ORAL
  Filled 2013-03-01 (×6): qty 1

## 2013-03-01 MED ORDER — MUPIROCIN 2 % EX OINT
TOPICAL_OINTMENT | Freq: Two times a day (BID) | CUTANEOUS | Status: DC
  Filled 2013-03-01: qty 22

## 2013-03-01 MED ORDER — FENTANYL CITRATE 0.05 MG/ML IJ SOLN: 25.0000 ug | INTRAMUSCULAR | Status: DC | PRN

## 2013-03-01 MED ORDER — OXYCODONE-ACETAMINOPHEN 5-325 MG PO TABS
1.0000 | ORAL_TABLET | ORAL | Status: DC | PRN
  Administered 2013-03-02 – 2013-03-07 (×19): 2 via ORAL
  Administered 2013-03-07: 1 via ORAL
  Filled 2013-03-01 (×21): qty 2

## 2013-03-01 MED ORDER — LACTATED RINGERS IV SOLN
INTRAVENOUS | Status: DC | PRN
  Administered 2013-03-01: 15:00:00 via INTRAVENOUS

## 2013-03-01 MED ORDER — HYDROMORPHONE HCL PF 1 MG/ML IJ SOLN
0.2500 mg | INTRAMUSCULAR | Status: DC | PRN
  Administered 2013-03-01 (×3): 0.5 mg via INTRAVENOUS

## 2013-03-01 MED ORDER — NALOXONE HCL 0.4 MG/ML IJ SOLN: 0.4000 mg | INTRAMUSCULAR | Status: DC | PRN

## 2013-03-01 MED ORDER — NAPHAZOLINE HCL 0.1 % OP SOLN: 2.0000 [drp] | Freq: Two times a day (BID) | OPHTHALMIC | Status: DC | PRN

## 2013-03-01 MED ORDER — VITAMIN D3 25 MCG (1000 UNIT) PO TABS
2000.0000 [IU] | ORAL_TABLET | Freq: Every day | ORAL | Status: DC
  Administered 2013-03-02 – 2013-03-07 (×6): 2000 [IU] via ORAL
  Filled 2013-03-01 (×6): qty 2

## 2013-03-01 MED ORDER — LACTATED RINGERS IV SOLN
INTRAVENOUS | Status: DC
  Administered 2013-03-01: 15:00:00 via INTRAVENOUS

## 2013-03-01 MED ORDER — MUPIROCIN 2 % EX OINT
TOPICAL_OINTMENT | CUTANEOUS | Status: AC
  Administered 2013-03-01: 14:00:00 via NASAL
  Filled 2013-03-01: qty 22

## 2013-03-01 MED ORDER — PIPERACILLIN-TAZOBACTAM 3.375 G IVPB
3.3750 g | Freq: Three times a day (TID) | INTRAVENOUS | Status: DC
  Administered 2013-03-01 – 2013-03-02 (×2): 3.375 g via INTRAVENOUS
  Filled 2013-03-01 (×6): qty 50

## 2013-03-01 MED ORDER — PROPOFOL 10 MG/ML IV BOLUS
INTRAVENOUS | Status: DC | PRN
  Administered 2013-03-01: 10 mg via INTRAVENOUS
  Administered 2013-03-01: 130 mg via INTRAVENOUS

## 2013-03-01 MED ORDER — GLYCOPYRROLATE 0.2 MG/ML IJ SOLN
INTRAMUSCULAR | Status: DC | PRN
  Administered 2013-03-01: 0.4 mg via INTRAVENOUS

## 2013-03-01 MED ORDER — 0.9 % SODIUM CHLORIDE (POUR BTL) OPTIME
TOPICAL | Status: DC | PRN
  Administered 2013-03-01: 1000 mL

## 2013-03-01 MED ORDER — SENNOSIDES-DOCUSATE SODIUM 8.6-50 MG PO TABS
1.0000 | ORAL_TABLET | Freq: Every evening | ORAL | Status: DC | PRN
  Administered 2013-03-05: 1 via ORAL
  Filled 2013-03-01 (×2): qty 1

## 2013-03-01 MED ORDER — BISACODYL 5 MG PO TBEC
10.0000 mg | DELAYED_RELEASE_TABLET | Freq: Every day | ORAL | Status: DC
  Administered 2013-03-02 – 2013-03-06 (×5): 10 mg via ORAL
  Filled 2013-03-01 (×5): qty 2

## 2013-03-01 MED ORDER — HYDROMORPHONE HCL PF 1 MG/ML IJ SOLN
INTRAMUSCULAR | Status: AC
  Filled 2013-03-01: qty 2

## 2013-03-01 MED ORDER — ADULT MULTIVITAMIN W/MINERALS CH
1.0000 | ORAL_TABLET | Freq: Every day | ORAL | Status: DC
  Administered 2013-03-02 – 2013-03-07 (×6): 1 via ORAL
  Filled 2013-03-01 (×6): qty 1

## 2013-03-01 MED ORDER — SODIUM CHLORIDE 0.9 % IJ SOLN: 9.0000 mL | INTRAMUSCULAR | Status: DC | PRN

## 2013-03-01 MED ORDER — ONDANSETRON HCL 4 MG/2ML IJ SOLN
INTRAMUSCULAR | Status: DC | PRN
  Administered 2013-03-01: 4 mg via INTRAVENOUS

## 2013-03-01 MED ORDER — POTASSIUM CHLORIDE 10 MEQ/50ML IV SOLN: 10.0000 meq | Freq: Every day | INTRAVENOUS | Status: DC | PRN

## 2013-03-01 MED ORDER — CEFUROXIME SODIUM 1.5 G IJ SOLR
1.5000 g | INTRAMUSCULAR | Status: DC
  Filled 2013-03-01: qty 1.5

## 2013-03-01 MED ORDER — VITAMIN B-12 1000 MCG PO TABS
1000.0000 ug | ORAL_TABLET | Freq: Every day | ORAL | Status: DC
  Administered 2013-03-02 – 2013-03-07 (×6): 1000 ug via ORAL
  Filled 2013-03-01 (×6): qty 1

## 2013-03-01 MED ORDER — ONDANSETRON HCL 4 MG/2ML IJ SOLN
4.0000 mg | Freq: Four times a day (QID) | INTRAMUSCULAR | Status: DC | PRN
  Administered 2013-03-02: 4 mg via INTRAVENOUS

## 2013-03-01 MED ORDER — SODIUM CHLORIDE 0.9 % IR SOLN
Status: DC | PRN
  Administered 2013-03-01: 3000 mL

## 2013-03-01 MED ORDER — LEVALBUTEROL HCL 0.63 MG/3ML IN NEBU
0.6300 mg | INHALATION_SOLUTION | Freq: Four times a day (QID) | RESPIRATORY_TRACT | Status: DC
  Administered 2013-03-02 (×4): 0.63 mg via RESPIRATORY_TRACT
  Filled 2013-03-01 (×8): qty 3

## 2013-03-01 MED ORDER — ROCURONIUM BROMIDE 100 MG/10ML IV SOLN
INTRAVENOUS | Status: DC | PRN
  Administered 2013-03-01: 50 mg via INTRAVENOUS

## 2013-03-01 MED ORDER — DIPHENHYDRAMINE HCL 50 MG/ML IJ SOLN: 12.5000 mg | Freq: Four times a day (QID) | INTRAMUSCULAR | Status: DC | PRN

## 2013-03-01 MED ORDER — OXYCODONE HCL 5 MG/5ML PO SOLN
5.0000 mg | Freq: Once | ORAL | Status: DC | PRN
Start: 2013-03-01 — End: 2013-03-01

## 2013-03-01 MED ORDER — SUCCINYLCHOLINE CHLORIDE 20 MG/ML IJ SOLN
INTRAMUSCULAR | Status: DC | PRN
  Administered 2013-03-01: 100 mg via INTRAVENOUS

## 2013-03-01 MED ORDER — OXYCODONE HCL 5 MG PO TABS: 5.0000 mg | ORAL_TABLET | Freq: Once | ORAL | Status: DC | PRN

## 2013-03-01 MED ORDER — FENTANYL 10 MCG/ML IV SOLN
INTRAVENOUS | Status: DC
  Administered 2013-03-01: 105 ug via INTRAVENOUS
  Administered 2013-03-01: 19:00:00 via INTRAVENOUS
  Administered 2013-03-02: 340.9 ug via INTRAVENOUS
  Administered 2013-03-02: 210 ug via INTRAVENOUS
  Administered 2013-03-02: 180 ug via INTRAVENOUS
  Administered 2013-03-02: 225 ug via INTRAVENOUS
  Administered 2013-03-02: 02:00:00 via INTRAVENOUS
  Administered 2013-03-02: 203.7 ug via INTRAVENOUS
  Administered 2013-03-02: 80 ug via INTRAVENOUS
  Administered 2013-03-02: 120 ug via INTRAVENOUS
  Administered 2013-03-02: 22:00:00 via INTRAVENOUS
  Administered 2013-03-03: 143 ug via INTRAVENOUS
  Administered 2013-03-03: 270 ug via INTRAVENOUS
  Administered 2013-03-03: 135 ug via INTRAVENOUS
  Administered 2013-03-03: 05:00:00 via INTRAVENOUS
  Administered 2013-03-03: 180 ug via INTRAVENOUS
  Administered 2013-03-04: 22:00:00 via INTRAVENOUS
  Administered 2013-03-04: 210 ug via INTRAVENOUS
  Administered 2013-03-04: 255 ug via INTRAVENOUS
  Administered 2013-03-04: 13:00:00 via INTRAVENOUS
  Administered 2013-03-04 (×2): 120 ug via INTRAVENOUS
  Administered 2013-03-04: 04:00:00 via INTRAVENOUS
  Administered 2013-03-04: 240 ug via INTRAVENOUS
  Administered 2013-03-04: 255 ug via INTRAVENOUS
  Administered 2013-03-05: 223.8 ug via INTRAVENOUS
  Administered 2013-03-05: 120 ug via INTRAVENOUS
  Administered 2013-03-05: 150 ug via INTRAVENOUS
  Administered 2013-03-05: 100 ug via INTRAVENOUS
  Administered 2013-03-05: 12:00:00 via INTRAVENOUS
  Administered 2013-03-05: 60 ug via INTRAVENOUS
  Administered 2013-03-06: 50 ug via INTRAVENOUS
  Administered 2013-03-06: 10 ug via INTRAVENOUS
  Administered 2013-03-06: 40 ug via INTRAVENOUS
  Filled 2013-03-01 (×11): qty 50

## 2013-03-01 SURGICAL SUPPLY — 76 items
APPLICATOR TIP COSEAL (VASCULAR PRODUCTS) IMPLANT
APPLICATOR TIP EXT COSEAL (VASCULAR PRODUCTS) IMPLANT
BLADE SURG 11 STRL SS (BLADE) ×3 IMPLANT
BRUSH CYTOL CELLEBRITY 1.5X140 (MISCELLANEOUS) IMPLANT
CANISTER SUCTION 2500CC (MISCELLANEOUS) ×3 IMPLANT
CATH KIT ON Q 5IN SLV (PAIN MANAGEMENT) IMPLANT
CATH MALECOT BARD  28FR (CATHETERS) ×9 IMPLANT
CATH THORACIC 28FR (CATHETERS) IMPLANT
CATH THORACIC 36FR (CATHETERS) IMPLANT
CATH THORACIC 36FR RT ANG (CATHETERS) IMPLANT
CLEANER TIP ELECTROSURG 2X2 (MISCELLANEOUS) ×3 IMPLANT
CLIP TI MEDIUM 6 (CLIP) IMPLANT
CLOTH BEACON ORANGE TIMEOUT ST (SAFETY) ×3 IMPLANT
CONN ST 1/4X3/8  BEN (MISCELLANEOUS) ×2
CONN ST 1/4X3/8 BEN (MISCELLANEOUS) ×4 IMPLANT
CONN Y 3/8X3/8X3/8  BEN (MISCELLANEOUS)
CONN Y 3/8X3/8X3/8 BEN (MISCELLANEOUS) IMPLANT
CONT SPEC 4OZ CLIKSEAL STRL BL (MISCELLANEOUS) ×6 IMPLANT
COVER SURGICAL LIGHT HANDLE (MISCELLANEOUS) ×3 IMPLANT
COVER TABLE BACK 60X90 (DRAPES) ×3 IMPLANT
DERMABOND ADVANCED (GAUZE/BANDAGES/DRESSINGS) ×1
DERMABOND ADVANCED .7 DNX12 (GAUZE/BANDAGES/DRESSINGS) ×2 IMPLANT
DRAPE LAPAROSCOPIC ABDOMINAL (DRAPES) ×3 IMPLANT
DRAPE WARM FLUID 44X44 (DRAPE) ×3 IMPLANT
DRILL BIT 7/64X5 (BIT) ×3 IMPLANT
ELECT BLADE 4.0 EZ CLEAN MEGAD (MISCELLANEOUS) ×3
ELECT REM PT RETURN 9FT ADLT (ELECTROSURGICAL) ×3
ELECTRODE BLDE 4.0 EZ CLN MEGD (MISCELLANEOUS) ×2 IMPLANT
ELECTRODE REM PT RTRN 9FT ADLT (ELECTROSURGICAL) ×2 IMPLANT
FORCEPS BIOP RJ4 1.8 (CUTTING FORCEPS) IMPLANT
GLOVE BIO SURGEON STRL SZ 6.5 (GLOVE) ×6 IMPLANT
GOWN STRL NON-REIN LRG LVL3 (GOWN DISPOSABLE) ×9 IMPLANT
HANDPIECE INTERPULSE COAX TIP (DISPOSABLE) ×1
KIT BASIN OR (CUSTOM PROCEDURE TRAY) ×3 IMPLANT
KIT ROOM TURNOVER OR (KITS) ×3 IMPLANT
KIT SUCTION CATH 14FR (SUCTIONS) ×3 IMPLANT
MARKER SKIN DUAL TIP RULER LAB (MISCELLANEOUS) ×3 IMPLANT
NEEDLE BIOPSY TRANSBRONCH 21G (NEEDLE) IMPLANT
NS IRRIG 1000ML POUR BTL (IV SOLUTION) ×6 IMPLANT
OIL SILICONE PENTAX (PARTS (SERVICE/REPAIRS)) ×3 IMPLANT
PACK CHEST (CUSTOM PROCEDURE TRAY) ×3 IMPLANT
PAD ARMBOARD 7.5X6 YLW CONV (MISCELLANEOUS) ×6 IMPLANT
SCISSORS LAP 5X35 DISP (ENDOMECHANICALS) IMPLANT
SEALANT PROGEL (MISCELLANEOUS) IMPLANT
SEALANT SURG COSEAL 4ML (VASCULAR PRODUCTS) IMPLANT
SEALANT SURG COSEAL 8ML (VASCULAR PRODUCTS) IMPLANT
SET HNDPC FAN SPRY TIP SCT (DISPOSABLE) ×2 IMPLANT
SOLUTION ANTI FOG 6CC (MISCELLANEOUS) ×3 IMPLANT
SPONGE GAUZE 4X4 12PLY (GAUZE/BANDAGES/DRESSINGS) ×3 IMPLANT
SUT PROLENE 3 0 SH DA (SUTURE) IMPLANT
SUT PROLENE 4 0 RB 1 (SUTURE)
SUT PROLENE 4-0 RB1 .5 CRCL 36 (SUTURE) IMPLANT
SUT SILK  1 MH (SUTURE) ×3
SUT SILK 1 MH (SUTURE) ×6 IMPLANT
SUT SILK 2 0SH CR/8 30 (SUTURE) IMPLANT
SUT SILK 3 0SH CR/8 30 (SUTURE) IMPLANT
SUT VIC AB 1 CTX 18 (SUTURE) ×3 IMPLANT
SUT VIC AB 1 CTX 36 (SUTURE)
SUT VIC AB 1 CTX36XBRD ANBCTR (SUTURE) IMPLANT
SUT VIC AB 2-0 CTX 36 (SUTURE) ×3 IMPLANT
SUT VIC AB 3-0 X1 27 (SUTURE) IMPLANT
SUT VICRYL 2 TP 1 (SUTURE) IMPLANT
SWAB COLLECTION DEVICE MRSA (MISCELLANEOUS) IMPLANT
SYR 20ML ECCENTRIC (SYRINGE) ×3 IMPLANT
SYSTEM SAHARA CHEST DRAIN ATS (WOUND CARE) ×3 IMPLANT
TAPE CLOTH 4X10 WHT NS (GAUZE/BANDAGES/DRESSINGS) ×3 IMPLANT
TAPE CLOTH SURG 4X10 WHT LF (GAUZE/BANDAGES/DRESSINGS) ×3 IMPLANT
TIP APPLICATOR SPRAY EXTEND 16 (VASCULAR PRODUCTS) IMPLANT
TOWEL OR 17X24 6PK STRL BLUE (TOWEL DISPOSABLE) ×6 IMPLANT
TOWEL OR 17X26 10 PK STRL BLUE (TOWEL DISPOSABLE) ×6 IMPLANT
TRAP SPECIMEN MUCOUS 40CC (MISCELLANEOUS) ×3 IMPLANT
TRAY FOLEY CATH 14FRSI W/METER (CATHETERS) ×3 IMPLANT
TUBE ANAEROBIC SPECIMEN COL (MISCELLANEOUS) IMPLANT
TUBE CONNECTING 12X1/4 (SUCTIONS) ×6 IMPLANT
TUNNELER SHEATH ON-Q 11GX8 (MISCELLANEOUS) IMPLANT
WATER STERILE IRR 1000ML POUR (IV SOLUTION) ×6 IMPLANT

## 2013-03-01 NOTE — Anesthesia Preprocedure Evaluation (Addendum)
Anesthesia Evaluation  Patient identified by MRN, date of birth, ID band Patient awake    Reviewed: Allergy & Precautions, H&P , NPO status , Patient's Chart, lab work & pertinent test results  Airway       Dental no notable dental hx. (+) Poor Dentition, Missing and Chipped,    Pulmonary shortness of breath, former smoker,  Chronic empyema   Pulmonary exam normal + decreased breath sounds      Cardiovascular negative cardio ROS      Neuro/Psych Seizures -, Well Controlled,  negative psych ROS   GI/Hepatic Neg liver ROS, GERD-  Medicated and Controlled,  Endo/Other  negative endocrine ROS  Renal/GU negative Renal ROS  negative genitourinary   Musculoskeletal   Abdominal   Peds  Hematology negative hematology ROS (+)   Anesthesia Other Findings   Reproductive/Obstetrics negative OB ROS                          Anesthesia Physical Anesthesia Plan  ASA: III  Anesthesia Plan: General   Post-op Pain Management:    Induction: Intravenous  Airway Management Planned: Double Lumen EBT  Additional Equipment: Arterial line and CVP  Intra-op Plan:   Post-operative Plan: Extubation in OR and Possible Post-op intubation/ventilation  Informed Consent: I have reviewed the patients History and Physical, chart, labs and discussed the procedure including the risks, benefits and alternatives for the proposed anesthesia with the patient or authorized representative who has indicated his/her understanding and acceptance.   Dental advisory given  Plan Discussed with: CRNA  Anesthesia Plan Comments:        Anesthesia Quick Evaluation

## 2013-03-01 NOTE — Preoperative (Signed)
Beta Blockers   Reason not to administer Beta Blockers:Not Applicable 

## 2013-03-01 NOTE — Progress Notes (Signed)
Patient ID: Alexander Gray, male   DOB: 13-Dec-1960, 52 y.o.   MRN: [id number]   SICU Evening Rounds:  Hemodynamically stable.  CT output low  Urine output ok

## 2013-03-01 NOTE — Brief Op Note (Signed)
03/01/2013  5:57 PM  PATIENT:  Alexander Gray  52 y.o. male  PRE-OPERATIVE DIAGNOSIS:   EMPYEMA  POST-OPERATIVE DIAGNOSIS:   EMPYEMA  PROCEDURE:  Procedure(s) with comments: VIDEO BRONCHOSCOPY (N/A) VIDEO ASSISTED THORACOSCOPY (VATS)/EMPYEMA (Left) - DRAINAGE OF EMPYEMA Decortication  SURGEON:  Surgeon(s) and Role:    * Delight Ovens, MD - Primary  PHYSICIAN ASSISTANT: D Zimmerman    ANESTHESIA:   general  EBL:  Total I/O In: 400 [I.V.:400] Out: 150 [Urine:150]  BLOOD ADMINISTERED:none  DRAINS: 3  Chest Tube(s) in the left chest   LOCAL MEDICATIONS USED:  NONE  SPECIMEN:  Source of Specimen:  left pleural space  DISPOSITION OF SPECIMEN:  PATHOLOGY  COUNTS:  YES  TOURNIQUET:  * No tourniquets in log *  DICTATION: .Dragon Dictation  PLAN OF CARE: Admit to inpatient   PATIENT DISPOSITION:  PACU - hemodynamically stable.   Delay start of Pharmacological VTE agent (>24hrs) due to surgical blood loss or risk of bleeding: yes

## 2013-03-01 NOTE — Transfer of Care (Signed)
Immediate Anesthesia Transfer of Care Note  Patient: Alexander Gray  Procedure(s) Performed: Procedure(s) with comments: VIDEO BRONCHOSCOPY (N/A) VIDEO ASSISTED THORACOSCOPY (VATS)/EMPYEMA (Left) - DRAINAGE OF EMPYEMA  Patient Location: PACU  Anesthesia Type:General  Level of Consciousness: alert , patient cooperative and responds to stimulation  Airway & Oxygen Therapy: Patient Spontanous Breathing and Patient connected to nasal cannula oxygen  Post-op Assessment: Report given to PACU RN and Post -op Vital signs reviewed and stable  Post vital signs: Reviewed and stable  Complications: No apparent anesthesia complications

## 2013-03-01 NOTE — Anesthesia Postprocedure Evaluation (Signed)
  Anesthesia Post-op Note  Patient: Alexander Gray  Procedure(s) Performed: Procedure(s) with comments: VIDEO BRONCHOSCOPY (N/A) VIDEO ASSISTED THORACOSCOPY (VATS)/EMPYEMA (Left) - DRAINAGE OF EMPYEMA  Patient Location: PACU  Anesthesia Type:General  Level of Consciousness: awake  Airway and Oxygen Therapy: Patient Spontanous Breathing  Post-op Pain: mild  Post-op Assessment: Post-op Vital signs reviewed, Patient's Cardiovascular Status Stable, Respiratory Function Stable, Patent Airway, No signs of Nausea or vomiting and Pain level controlled  Post-op Vital Signs: stable  Complications: No apparent anesthesia complications

## 2013-03-01 NOTE — Progress Notes (Signed)
ANTIBIOTIC CONSULT NOTE - INITIAL  Pharmacy Consult for Vancomycin and Zosyn Indication: s/p drainage of empyema -effusion growing Viridians strept No Known Allergies  Patient Measurements: Height: 5\' 10"  (177.8 cm) Weight: 199 lb 8.3 oz (90.5 kg) IBW/kg (Calculated) : 73  Vital Signs: Temp: 98.1 F (36.7 C) (06/24 1830) Temp src: Oral (06/24 0950) BP: 120/75 mmHg (06/24 1945) Pulse Rate: 112 (06/24 1945) Intake/Output from previous day:   Intake/Output from this shift:    Labs:  Recent Labs  03/01/13 1314  WBC 18.7*  HGB 9.5*  PLT 583*  CREATININE 0.68   Estimated Creatinine Clearance: 123.6 ml/min (by C-G formula based on Cr of 0.68). No results found for this basename: VANCOTROUGH, Leodis Binet, VANCORANDOM, GENTTROUGH, GENTPEAK, GENTRANDOM, TOBRATROUGH, TOBRAPEAK, TOBRARND, AMIKACINPEAK, AMIKACINTROU, AMIKACIN,  in the last 72 hours   Microbiology: Recent Results (from the past 720 hour(s))  BODY FLUID CULTURE     Status: None   Collection Time    02/23/13 10:20 AM      Result Value Range Status   Specimen Description FLUID LEFT PLEURAL SPACE   Final   Special Requests NONE   Final   Gram Stain     Final   Value: FEW WBC PRESENT, PREDOMINANTLY PMN     NO ORGANISMS SEEN   Culture     Final   Value: ABUNDANT VIRIDANS STREPTOCOCCUS     Note: CRITICAL RESULT CALLED TO, READ BACK BY AND VERIFIED WITH: TONY FLOW MANAGER @ 1257 02/25/13   Report Status 02/27/2013 FINAL   Final   Organism ID, Bacteria VIRIDANS STREPTOCOCCUS   Final  AFB CULTURE WITH SMEAR     Status: None   Collection Time    02/23/13 10:20 AM      Result Value Range Status   Specimen Description FLUID LEFT PLEURAL SPACE   Final   Special Requests NONE   Final   ACID FAST SMEAR NO ACID FAST BACILLI SEEN   Final   Culture     Final   Value: CULTURE WILL BE EXAMINED FOR 6 WEEKS BEFORE ISSUING A FINAL REPORT   Report Status PENDING   Incomplete  SURGICAL PCR SCREEN     Status: None   Collection Time     03/01/13  1:14 PM      Result Value Range Status   MRSA, PCR NEGATIVE  NEGATIVE Final   Staphylococcus aureus NEGATIVE  NEGATIVE Final   Comment:            The Xpert SA Assay (FDA     approved for NASAL specimens     in patients over 91 years of age),     is one component of     a comprehensive surveillance     program.  Test performance has     been validated by [employer] for patients greater     than or equal to 59 year old.     It is not intended     to diagnose infection nor to     guide or monitor treatment.    Medical History: Past Medical History  Diagnosis Date  . Hemoptysis     2009  . Pleural effusion, left     02/28/13  . S/P thoracentesis 02/23/13    left  . Gastritis 2009    vomiting blood  . Shortness of breath     Hx: of  . Kidney stones     Hx: of  . GERD (  gastroesophageal reflux disease)   . Seizures     hx: of     Medications:  Scheduled:  . acetaminophen  1,000 mg Intravenous Q6H  . bisacodyl  10 mg Oral Daily  . cholecalciferol  2,000 Units Oral Daily  . fentaNYL   Intravenous Q4H  . HYDROmorphone      . levalbuterol  0.63 mg Nebulization Q6H  . multivitamin with minerals  1 tablet Oral Daily  . mupirocin ointment   Nasal BID  . vitamin B-12  1,000 mcg Oral Daily  . vitamin C  500 mg Oral Daily   Assessment: 52 yr old male transferred from [facility] where he was admitted with large pleural effusion- after thoracentesis, fluid grew strep viridians.  Patient is now s/p VATS/drainage of empyema starting on vancomycin and zosyn.    Goal of Therapy:  Vancomycin trough level 15-20 mcg/ml  Plan:  Start Vancomycin 1000mg  IV q8hrs and Zosyn 3.375 gm IV q8hrs (4 hr infusion). F/u clinical status, renal function, culture data, vanc trough levels at steady state.  Wendie Simmer, PharmD, BCPS Clinical Pharmacist  Pager: [phone]   Marylouise Stacks 03/01/2013,7:56 PM

## 2013-03-01 NOTE — H&P (Signed)
[address]       [place] [address]             [phone]                    [facility] Medical Record #[id number] Date of Birth: Apr 12, 1961  Referring: Fredirick Maudlin, MD Primary Care: Fredirick Maudlin, MD  Chief Complaint:    Chief Complaint  Patient presents with  . Pleural Effusion    left/empyema......Marland Kitcheneval and treat    History of Present Illness:    Patient is a 52 year old male with previous long smoking history who recently presented at [facility] emergency room with a three-week history of "pinched nerve" involving his left neck and chest and left flank. He was taking up to 10 Aleve a day for the pain. On June 16 he went to the [facility] emergency room CT scan of the chest showed a large left complex pleural effusion. He was started on Levaquin, a thoracentesis was done draining approximately 1500 cc of fluid, Dr. Juanetta Gosling reports today that this fluid grew strep viridans. The patient denies any fever or chills. He has noted increasing weakness over the past week and increasing pedal edema. He has been a long-term smoker starting as a teenager. He quit smoking in February 2014. He notes a previous history of heavy alcohol use but none for the past year.  He's had no hemoptysis. He denies any exposure to tuberculosis that he is aware of. He denies working with asbestos.  He recently came here from [place] where he lives to visit with his parents.  Current Activity/ Functional Status:  Patient is independent with mobility/ambulation, transfers, ADL's, IADL's.  Zubrod Score: At the time of surgery this patient's most appropriate activity status/level should be described as: []  Normal activity, no symptoms [x]  Symptoms, fully ambulatory []  Symptoms, in bed less than or equal to 50% of the time []  Symptoms, in bed greater than 50% of the time but less than 100% []  Bedridden []  Moribund   Past Medical History  Diagnosis Date    . Hemoptysis     2009  . Pleural effusion, left     02/28/13  . S/P thoracentesis 02/23/13    left  . Gastritis 2009    vomiting blood  . Shortness of breath     Hx: of  . Kidney stones     Hx: of  . GERD (gastroesophageal reflux disease)   . Seizures     hx: of     Past Surgical History  Procedure Laterality Date  . Tonsillectomy      Family history: Patient denies any family members with tuberculosis his father is alive and here in the office today with a history of hypertension hyperlipidemia his mother is also here has a history of hypertension he has 2 brothers who are healthy  History   Social History  . Marital Status: Single    Spouse Name: N/A    Number of Children: N/A  . Years of Education: N/A   Occupational History  .  currently unemployed    Social History Main Topics  . Smoking status: Former Smoker -- 1.00 packs/day for 35 years    Types: Cigarettes    Quit date: 09/30/2012  . Smokeless tobacco: Never Used     Comment: vapor for 6 months  . Alcohol Use: No  . Drug Use: Not on file  . Sexually Active:  Not on file    Social History Narrative  . No narrative on file    History  Smoking status  . Former Smoker -- 1.00 packs/day for 35 years  . Types: Cigarettes  . Quit date: 09/30/2012  Smokeless tobacco  . Never Used    Comment: vapor for 6 months    History  Alcohol Use No     No Known Allergies  Current Facility-Administered Medications  Medication Dose Route Frequency Provider Last Rate Last Dose  . cefUROXime (ZINACEF) 1.5 g in dextrose 5 % 50 mL IVPB  1.5 g Intravenous 60 min Pre-Op Delight Ovens, MD      . lactated ringers infusion   Intravenous Continuous Delight Ovens, MD 50 mL/hr at 03/01/13 1509    . mupirocin ointment (BACTROBAN) 2 %   Nasal BID Delight Ovens, MD           Review of Systems:     Cardiac Review of Systems: Y or N  Chest Pain [ y   ]  Resting SOB [ y  ] Exertional SOB  Cove.Etienne  ]  Pollyann Kennedy Cove.Etienne   ]   Pedal Edema Cove.Etienne   ]    Palpitations [ n ] Syncope  [ n ]   Presyncope [ n  ]  General Review of Systems: [Y] = yes [  ]=no Constitional: recent weight change [ y ]; anorexia [  ]; fatigue [  ]; nausea [  ]; night sweats [n  ]; fever [ n ]; or chills [ n ];                                                                                                                                          Dental: poor dentition[ y ]; Last Dentist visit:   Eye : blurred vision [  ]; diplopia [   ]; vision changes [  ];  Amaurosis fugax[  ]; Resp: cough [  ];  wheezing[  ];  hemoptysis[n  ]; shortness of breath[y  ]; paroxysmal nocturnal dyspnea[y  ]; dyspnea on exertion[ y ]; or orthopnea[y  ];  GI:  gallstones[  ], vomiting[ n ];  dysphagia[  ]; melena[  ];  hematochezia [  ]; heartburn[  ];   Hx of  Colonoscopy[  ]; GU: kidney stones [  ]; hematuria[  ];   dysuria Cove.Etienne  ];  nocturia[  ];  history of     obstruction [  ]; urinary frequency Cove.Etienne  ]             Skin: rash, swelling[  ];, hair loss[  ];  peripheral edema[  ];  or itching[  ]; Musculosketetal: myalgias[  ];  joint swelling[  ];  joint erythema[  ];  joint pain[  ];  back pain[  ];  Heme/Lymph: bruising[  ];  bleeding[  ];  anemia[  ];  Neuro: TIA[  ];  headaches[  ];  stroke[  ];  vertigo[  ];  seizures[  ];   paresthesias[  ];  difficulty walking[  ];  Psych:depression[  ]; anxiety[ y ];  Endocrine: diabetes[n  ];  thyroid dysfunction[n  ];  Immunizations: Flu Milo.Brash  ]; Pneumococcal[  n];  Other:  Physical Exam: BP 120/80  Pulse 100  Temp(Src) 97 F (36.1 C) (Oral)  Resp 16  Ht 5\' 10"  (1.778 m)  Wt 199 lb 8.3 oz (90.5 kg)  BMI 28.63 kg/m2  SpO2 96%  General appearance: alert, cooperative, appears stated age and no distress Neurologic: intact Heart: regular rate and rhythm, S1, S2 normal, no murmur, click, rub or gallop and normal apical impulse Lungs: diminished breath sounds LLL and LUL Abdomen: soft, non-tender; bowel sounds normal;  no masses,  no organomegaly Extremities: edema bilaterial ankle edema and Homans sign is negative, no sign of DVT no carotid bruits   Diagnostic Studies & Laboratory data:     Recent Radiology Findings:  Dg Chest 2 View  03/01/2013   *RADIOLOGY REPORT*  Clinical Data: Preoperative radiograph.  Bronchoscopy today.  CHEST - 2 VIEW  Comparison: Radiograph 02/23/2013.  CT 02/21/2013.  Findings: Increasing left pleural effusion is present.  Very little left lung remains aerated.  Rightward mediastinal shift.  Old right rib fractures.  No airspace disease in the right lung.  Rightward tracheal deviation. Effusion is loculated with a substantial anterior component identified on the lateral view. Cardiopericardial silhouette appears similar to the prior exam allowing for mediastinal shift.  IMPRESSION: Increasing loculated left pleural effusion.  Unchanged rightward mediastinal shift associated with mass effect from loculated left pleural effusion.   Original Report Authenticated By: Andreas Newport, M.D.   Dg Chest 1 View  02/23/2013   *RADIOLOGY REPORT*  Clinical Data: Left pleural effusion post thoracentesis with removal of 1550 ml of serosanguineous fluid from the left hemithorax  CHEST - 1 VIEW  Comparison: Chest CT 02/21/2013  Findings: Minimal mediastinal shift left to right. Persistent moderate sized left pleural effusion despite removal of greater than 1.5 liters of fluid from the left chest. Persistent atelectasis of left lower lobe. Right lung clear. No pneumothorax. No acute osseous findings. Old healed fracture posterolateral right 6th rib.  IMPRESSION: No pneumothorax following left thoracentesis. Persistent moderate left pleural effusion and significant left lower lobe atelectasis despite removal of 1550 ml of serosanguineous fluid from the left chest.  No shortness of breath following thoracentesis. Patient states he feels symptomatically improved. Discussed with the patient the need for deep  breathing and coughing to help re-expand the atelectatic left lower lobe.   Original Report Authenticated By: Ulyses Southward, M.D.   Ct Angio Chest W/cm &/or Wo Cm  02/21/2013   *RADIOLOGY REPORT*  Clinical Data:  Chest pain.  Abdominal pain.  CT ANGIOGRAPHY CHEST WITH CONTRAST  Technique:  Multidetector CT imaging of the chest was performed using the standard protocol during bolus administration of intravenous contrast.  Multiplanar CT image reconstructions including MIPs were obtained to evaluate the vascular anatomy.  Contrast: 50mL OMNIPAQUE IOHEXOL 300 MG/ML  SOLN, OMNIPAQUE IOHEXOL 350 MG/ML SOLN  Comparison:   None.  Findings:  There are no filling defects in the pulmonary arterial tree to suggest acute pulmonary thromboembolism.  There is a large loculated left pleural effusion causing compressive atelectasis have nearly the entire left lung.  The parietal pleura surrounding the left pleural effusion is slightly thickened  and enhances posteriorly compatible with complicated pleural fluid.  There is shift of the mediastinum to the right secondary to mass effect from the loculated pleural effusion.  11 mm short axis diameter right paratracheal node on image 20. Small prevascular nodes.  11 mm short axis left hilar node adjacent to the left mainstem bronchus and inferior on image 47. Other smaller mediastinal nodes are noted.  No obvious endobronchial lesion in the left mainstem bronchus. There is narrowing of the tertiary segment bronchus to the lateral basal segment of the left lower lobe on image 47.  The anterior basal segment tertiary bronchus is difficult to visualize and may be occluded.  There are two areas of ground-glass at the right apex which are nonspecific.  They measure 9 and 10 mm.  Chronic right-sided rib deformity with healing.  No definite acute rib fractures.  No vertebral compression deformity.  Review of the MIP images confirms the above findings.  IMPRESSION: No evidence of acute  pulmonary thromboembolism.  Large complicated left pleural effusion exerting mass effect, mediastinum and shift to the right.  Considerations include an inflammatory process such as empyema or malignant pleural effusion.  Left lower lobe tertiary segment areas are abnormal with areas of narrowing and/or occlusion.  Underlying endobronchial lesion or mucous plugging cannot be excluded.  Bronchoscopy may be helpful.  Abnormal mediastinal adenopathy.  Given the other findings, malignancy or inflammatory process are not excluded.  Nonspecific ground-glass at the right lung apex. Adenocarcinoma cannot be excluded.  Followup by CT is recommended in 12 months, with continued annual surveillance for a minimum of 3 years.  These recommendations are taken from "Recommendations for the Management of Subsolid Pulmonary Nodules Detected at CT:  A Statement from the Fleischner Society" Radiology 2013; 266:1, 304- 317.  CT ABDOMEN AND PELVIS WITH CONTRAST  Technique:  Multidetector CT imaging of the abdomen and pelvis was performed using the standard protocol following bolus administration of intravenous contrast.  Contrast: 50mL OMNIPAQUE IOHEXOL 300 MG/ML  SOLN, OMNIPAQUE IOHEXOL 350 MG/ML SOLN  Comparison:   None.  Findings:  The gallbladder is decompressed.  The liver, spleen, pancreas, adrenal glands, and kidneys are within normal limits.  The larger locular left pleural effusion exerts mass effect upon the hemidiaphragm causing downward displacement.  This could be contributing to left upper quadrant pain.  Moderate stool burden throughout the colon without evidence of obstruction or focal wall thickening.  Normal appendix.  Bladder and prostate are within normal limits.  Mild atherosclerotic changes of the aorta.  No destructive bony process.  IMPRESSION: No acute intra-abdominal pathology.  The large loculated left pleural effusion exerts mass effect upon the left hemidiaphragm with downward displacement.  This could  be contributing to left upper quadrant pain.   Original Report Authenticated By: Jolaine Click, M.D.   Ct Abdomen Pelvis W Contrast  02/21/2013   *RADIOLOGY REPORT*  Clinical Data:  Chest pain.  Abdominal pain.  CT ANGIOGRAPHY CHEST WITH CONTRAST  Technique:  Multidetector CT imaging of the chest was performed using the standard protocol during bolus administration of intravenous contrast.  Multiplanar CT image reconstructions including MIPs were obtained to evaluate the vascular anatomy.  Contrast: 50mL OMNIPAQUE IOHEXOL 300 MG/ML  SOLN, OMNIPAQUE IOHEXOL 350 MG/ML SOLN  Comparison:   None.  Findings:  There are no filling defects in the pulmonary arterial tree to suggest acute pulmonary thromboembolism.  There is a large loculated left pleural effusion causing compressive atelectasis have nearly the entire left  lung.  The parietal pleura surrounding the left pleural effusion is slightly thickened and enhances posteriorly compatible with complicated pleural fluid.  There is shift of the mediastinum to the right secondary to mass effect from the loculated pleural effusion.  11 mm short axis diameter right paratracheal node on image 20. Small prevascular nodes.  11 mm short axis left hilar node adjacent to the left mainstem bronchus and inferior on image 47. Other smaller mediastinal nodes are noted.  No obvious endobronchial lesion in the left mainstem bronchus. There is narrowing of the tertiary segment bronchus to the lateral basal segment of the left lower lobe on image 47.  The anterior basal segment tertiary bronchus is difficult to visualize and may be occluded.  There are two areas of ground-glass at the right apex which are nonspecific.  They measure 9 and 10 mm.  Chronic right-sided rib deformity with healing.  No definite acute rib fractures.  No vertebral compression deformity.  Review of the MIP images confirms the above findings.  IMPRESSION: No evidence of acute pulmonary thromboembolism.  Large  complicated left pleural effusion exerting mass effect, mediastinum and shift to the right.  Considerations include an inflammatory process such as empyema or malignant pleural effusion.  Left lower lobe tertiary segment areas are abnormal with areas of narrowing and/or occlusion.  Underlying endobronchial lesion or mucous plugging cannot be excluded.  Bronchoscopy may be helpful.  Abnormal mediastinal adenopathy.  Given the other findings, malignancy or inflammatory process are not excluded.  Nonspecific ground-glass at the right lung apex. Adenocarcinoma cannot be excluded.  Followup by CT is recommended in 12 months, with continued annual surveillance for a minimum of 3 years.  These recommendations are taken from "Recommendations for the Management of Subsolid Pulmonary Nodules Detected at CT:  A Statement from the Fleischner Society" Radiology 2013; 266:1, 304- 317.  CT ABDOMEN AND PELVIS WITH CONTRAST  Technique:  Multidetector CT imaging of the abdomen and pelvis was performed using the standard protocol following bolus administration of intravenous contrast.  Contrast: 50mL OMNIPAQUE IOHEXOL 300 MG/ML  SOLN, OMNIPAQUE IOHEXOL 350 MG/ML SOLN  Comparison:   None.  Findings:  The gallbladder is decompressed.  The liver, spleen, pancreas, adrenal glands, and kidneys are within normal limits.  The larger locular left pleural effusion exerts mass effect upon the hemidiaphragm causing downward displacement.  This could be contributing to left upper quadrant pain.  Moderate stool burden throughout the colon without evidence of obstruction or focal wall thickening.  Normal appendix.  Bladder and prostate are within normal limits.  Mild atherosclerotic changes of the aorta.  No destructive bony process.  IMPRESSION: No acute intra-abdominal pathology.  The large loculated left pleural effusion exerts mass effect upon the left hemidiaphragm with downward displacement.  This could be contributing to left upper  quadrant pain.   Original Report Authenticated By: Jolaine Click, M.D.   US Thoracentesis Asp Pleural Space W/img Guide  02/23/2013   *RADIOLOGY REPORT*  CLINICAL DATA: Large left pleural effusion, shortness of breath  ULTRASOUND GUIDED LEFT THORACENTESIS:  Technique: After explanation of procedure and risks, written informed consent for thoracentesis obtained. Time out protocol followed. Left pleural effusion localized by ultrasound. Site at posterior leftchest prepped and draped in usual sterile fashion. Skin and chest wall anesthetized with 6.5 ml of 1% lidocaine. Standard 8-French thoracentesis catheter placed into left pleural space. Poor fluid flow was encountered. Catheter was removed and a 5-French Yueh needle was placed into left hemithorax at the  same site. Good flow of fluid was encountered. 1550 ml of serosanguineous fluid aspirated by syringe pump. Procedure tolerated well by patient without immediate complication.  IMPRESSION: Ultrasound-guided left thoracentesis with removal of 1550 ml of left pleural fluid. Fluid sent to laboratory for requested analysis.   Original Report Authenticated By: Ulyses Southward, M.D.    Recent Lab Findings: Lab Results  Component Value Date   WBC 18.7* 03/01/2013   HGB 9.5* 03/01/2013   HCT 28.3* 03/01/2013   PLT 583* 03/01/2013   GLUCOSE 91 03/01/2013   ALT 13 03/01/2013   AST 19 03/01/2013   NA 133* 03/01/2013   K 4.4 03/01/2013   CL 97 03/01/2013   CREATININE 0.68 03/01/2013   BUN 14 03/01/2013   CO2 25 03/01/2013   INR 1.10 03/01/2013   MICRO: of pleural fluid  ABUNDANT VIRIDANS STREPTOCOCCUS no AFB on smear  PLEURAL FLUID, LEFT: REACTIVE MESOTHELIAL CELLS PRESENT. ACUTE INFLAMMATION.   Assessment / Plan:      Patient with large left loculated effusion, now growing  ABUNDANT VIRIDANS STREPTOCOCCUS. Cytology is negative for malignancy on thoracentesis. Because of the incomplete drainage a thoracentesis alone I recommended to the patient that we proceed  tomorrow with bronchoscopy left video-assisted thoracoscopy and drainage of empyema. The risks and options have been discussed with the patient and his parents in detail. His questions have been answered and he is willing to proceed.  The goals risks and alternatives of the planned surgical procedure Bronch, Left VATS drainage of empyema   have been discussed with the patient in detail. The risks of the procedure including death, infection, stroke, myocardial infarction, bleeding, blood transfusion have all been discussed specifically.  I have quoted Laurence Spates a 5 % of perioperative mortality and a complication rate as high as 20 %. The patient's questions have been answered.Nero Nohr is willing  to proceed with the planned procedure.  Delight Ovens MD      [address] [place] [place] [address] Office [phone]   Beeper [phone]  03/01/2013 3:21 PM

## 2013-03-02 ENCOUNTER — Inpatient Hospital Stay (HOSPITAL_COMMUNITY): Payer: Self-pay

## 2013-03-02 ENCOUNTER — Encounter (HOSPITAL_COMMUNITY): Payer: Self-pay | Admitting: Cardiothoracic Surgery

## 2013-03-02 DIAGNOSIS — J438 Other emphysema: Secondary | ICD-10-CM | POA: Diagnosis present

## 2013-03-02 LAB — BASIC METABOLIC PANEL
CO2: 29 mEq/L (ref 19–32)
Calcium: 7.9 mg/dL — ABNORMAL LOW (ref 8.4–10.5)
Chloride: 97 mEq/L (ref 96–112)
Glucose, Bld: 105 mg/dL — ABNORMAL HIGH (ref 70–99)
Sodium: 133 mEq/L — ABNORMAL LOW (ref 135–145)

## 2013-03-02 LAB — CBC
Hemoglobin: 9.2 g/dL — ABNORMAL LOW (ref 13.0–17.0)
MCH: 27.3 pg (ref 26.0–34.0)
MCV: 83.1 fL (ref 78.0–100.0)
Platelets: 575 10*3/uL — ABNORMAL HIGH (ref 150–400)
RBC: 3.37 MIL/uL — ABNORMAL LOW (ref 4.22–5.81)
WBC: 14.7 10*3/uL — ABNORMAL HIGH (ref 4.0–10.5)

## 2013-03-02 LAB — POCT I-STAT 3, ART BLOOD GAS (G3+)
Acid-Base Excess: 4 mmol/L — ABNORMAL HIGH (ref 0.0–2.0)
Bicarbonate: 29.2 mEq/L — ABNORMAL HIGH (ref 20.0–24.0)
O2 Saturation: 94 %
Patient temperature: 98.4
TCO2: 31 mmol/L (ref 0–100)
pCO2 arterial: 47.1 mmHg — ABNORMAL HIGH (ref 35.0–45.0)
pH, Arterial: 7.4 (ref 7.350–7.450)
pO2, Arterial: 73 mmHg — ABNORMAL LOW (ref 80.0–100.0)

## 2013-03-02 LAB — PATHOLOGIST SMEAR REVIEW

## 2013-03-02 MED ORDER — LEVALBUTEROL HCL 0.63 MG/3ML IN NEBU
0.6300 mg | INHALATION_SOLUTION | Freq: Three times a day (TID) | RESPIRATORY_TRACT | Status: DC
  Administered 2013-03-03 – 2013-03-07 (×12): 0.63 mg via RESPIRATORY_TRACT
  Filled 2013-03-02 (×18): qty 3

## 2013-03-02 MED ORDER — DEXTROSE 5 % IV SOLN
2.0000 g | INTRAVENOUS | Status: DC
  Administered 2013-03-02 – 2013-03-07 (×6): 2 g via INTRAVENOUS
  Filled 2013-03-02 (×6): qty 2

## 2013-03-02 MED ORDER — ENOXAPARIN SODIUM 30 MG/0.3ML ~~LOC~~ SOLN
30.0000 mg | SUBCUTANEOUS | Status: DC
  Administered 2013-03-02 – 2013-03-05 (×4): 30 mg via SUBCUTANEOUS
  Administered 2013-03-06: 12:00:00 via SUBCUTANEOUS
  Administered 2013-03-07: 30 mg via SUBCUTANEOUS
  Filled 2013-03-02 (×6): qty 0.3

## 2013-03-02 NOTE — Progress Notes (Signed)
UR Completed.  Alexander Gray [phone] 03/02/2013  

## 2013-03-02 NOTE — Op Note (Signed)
Alexander, Gray NO.:  [id number]  MEDICAL RECORD NO.:  [id number]  LOCATION:  2313                         FACILITY:  [facility]  PHYSICIAN:  Sheliah Plane, MD    DATE OF BIRTH:  1961/03/23  DATE OF PROCEDURE:  03/01/2013 DATE OF DISCHARGE:                              OPERATIVE REPORT   PREOPERATIVE DIAGNOSIS:  Left empyema.  POSTOPERATIVE DIAGNOSIS:  Left empyema.  PROCEDURE:  Bronchoscopy, left video-assisted thoracoscopy, drainage of empyema, and decortication.  SURGEON:  Sheliah Plane, MD  FIRST ASSISTANT:  Doree Fudge, PA  BRIEF HISTORY:  The patient is a 52 year old male who presented to Dr. Kari Baars several days prior with increasing left chest pain and shortness of breath.  CT scan of the chest revealed a large multiloculated pleural effusion.  Thoracentesis drained 1500 mL and several days later grew strep viridans.  Because of the findings on CT scan and culture results, the patient was referred for drainage of pleural fluid.  The patient had, had some shortness of breath and discomfort, but denies any fever or chills.  He is a long-term smoker, had a previous history of heavy alcohol use, but denies any alcohol use in the past year.  Risks and options were discussed with him and he agreed and signed informed consent.  DESCRIPTION OF PROCEDURE:  With central line and arterial line in place, the patient underwent general endotracheal anesthesia without incident. Single-lumen endotracheal tube was placed.  Through this, a 2-mm fiberoptic bronchoscope was passed to the subsegmental level without evidence of endobronchial lesions.  The scope was removed.  The patient was turned in the lateral decubitus position, and the chest prepped with Betadine with the left side up.  Two port sites were created and through these almost 4 L of murky fluid were drained, one port site was enlarged slightly without rib spreading to facilitate  removal of significant amount of pleural debris, which was sent for Pathology.  With the fluid removed as much of the proteinaceous debris that could be removed, the chest was copiously irrigated.  Three Blake drains were left in the pleural space.  A small incision was then closed with interrupted 0-Vicryl, running 3-0 Vicryl in the subcutaneous tissue, and 4-0 subcuticular stitch in skin edges.  Dermabond was applied.  The patient was awakened and extubated in the operating room and extubated and transferred to recovery room for further postop care.     Sheliah Plane, MD     EG/MEDQ  D:  03/02/2013  T:  03/02/2013  Job:  [id number]  cc:   Ramon Dredge L. Juanetta Gosling, M.D.

## 2013-03-02 NOTE — ED Notes (Signed)
Patient was contacted:He had surgery on 03/01/2013.

## 2013-03-02 NOTE — Progress Notes (Signed)
Admitted to room 3305 via wheelchair, no complaints, chg done. Resting in bed

## 2013-03-02 NOTE — Progress Notes (Signed)
Report called to 3300.  All concerns/questions addressed.  Pt transferred with co-worker via wheelchair.  All VS were wnl on departure.  Receiving nurse advised of the d/c'd aline and foley at 1300.

## 2013-03-02 NOTE — Progress Notes (Signed)
Patient ID: Alexander Gray, male   DOB: 28-Dec-1960, 52 y.o.   MRN: [id number] [facility] DAILY ICU PROGRESS NOTE                   [address]            [name] [address]          [phone]   1 Day Post-Op Procedure(s) (LRB): VIDEO BRONCHOSCOPY (N/A) VIDEO ASSISTED THORACOSCOPY (VATS)/EMPYEMA (Left)  Total Length of Stay:  LOS: 1 day   Subjective: Stable, awake, walked this am  Objective: Vital signs in last 24 hours: Temp:  [97.4 F (36.3 C)-98.4 F (36.9 C)] 97.8 F (36.6 C) (06/25 0816) Pulse Rate:  [68-112] 77 (06/25 0800) Cardiac Rhythm:  [-] Normal sinus rhythm (06/25 0800) Resp:  [11-28] 15 (06/25 0800) BP: (80-142)/(48-80) 108/56 mmHg (06/25 0800) SpO2:  [91 %-99 %] 99 % (06/25 0800) Arterial Line BP: (103-167)/(45-92) 109/51 mmHg (06/25 0800) FiO2 (%):  [99 %] 99 % (06/25 0800) Weight:  [194 lb 0.1 oz (88 kg)-199 lb 8.3 oz (90.5 kg)] 194 lb 0.1 oz (88 kg) (06/25 0600)  Filed Weights   03/01/13 0950 03/01/13 1252 03/02/13 0600  Weight: 199 lb 4 oz (90.379 kg) 199 lb 8.3 oz (90.5 kg) 194 lb 0.1 oz (88 kg)    Weight change: -3 lb 12 oz (-1.701 kg)   Hemodynamic parameters for last 24 hours:    Intake/Output from previous day: 06/24 0701 - 06/25 0700 In: 2623.1 [P.O.:240; I.V.:1595.6; IV Piggyback:787.5] Out: 1390 [Urine:855; Chest Tube:535]  Intake/Output this shift: Total I/O In: 130.5 [I.V.:118; IV Piggyback:12.5] Out: 120 [Urine:100; Chest Tube:20]  Current Meds: Scheduled Meds: . acetaminophen  1,000 mg Intravenous Q6H  . bisacodyl  10 mg Oral Daily  . cholecalciferol  2,000 Units Oral Daily  . fentaNYL   Intravenous Q4H  . levalbuterol  0.63 mg Nebulization Q6H  . multivitamin with minerals  1 tablet Oral Daily  . piperacillin-tazobactam (ZOSYN)  IV  3.375 g Intravenous Q8H  . vancomycin  1,000 mg Intravenous Q8H  . vitamin B-12  1,000 mcg Oral Daily  . vitamin C  500 mg Oral Daily   Continuous Infusions: . dextrose 5 %  and 0.45% NaCl 100 mL/hr at 03/02/13 0800   PRN Meds:.diphenhydrAMINE, diphenhydrAMINE, fentaNYL, naloxone, naphazoline, ondansetron (ZOFRAN) IV, ondansetron (ZOFRAN) IV, oxyCODONE, oxyCODONE-acetaminophen, potassium chloride, senna-docusate, sodium chloride  General appearance: alert, cooperative and no distress Neurologic: intact Heart: regular rate and rhythm, S1, S2 normal, no murmur, click, rub or gallop Lungs: diminished breath sounds LLL and LUL Abdomen: soft, non-tender; bowel sounds normal; no masses,  no organomegaly Extremities: extremities normal, atraumatic, no cyanosis or edema and Homans sign is negative, no sign of DVT Wound: no air leak  Lab Results: CBC: Recent Labs  03/01/13 1314 03/02/13 0450  WBC 18.7* 14.7*  HGB 9.5* 9.2*  HCT 28.3* 28.0*  PLT 583* 575*   BMET:  Recent Labs  03/01/13 1314 03/02/13 0450  NA 133* 133*  K 4.4 4.2  CL 97 97  CO2 25 29  GLUCOSE 91 105*  BUN 14 9  CREATININE 0.68 0.64  CALCIUM 8.7 7.9*    PT/INR:  Recent Labs  03/01/13 1314  LABPROT 14.1  INR 1.10   Radiology: Dg Chest 2 View  03/01/2013   *RADIOLOGY REPORT*  Clinical Data: Preoperative radiograph.  Bronchoscopy today.  CHEST - 2 VIEW  Comparison: Radiograph 02/23/2013.  CT 02/21/2013.  Findings: Increasing left pleural  effusion is present.  Very little left lung remains aerated.  Rightward mediastinal shift.  Old right rib fractures.  No airspace disease in the right lung.  Rightward tracheal deviation. Effusion is loculated with a substantial anterior component identified on the lateral view. Cardiopericardial silhouette appears similar to the prior exam allowing for mediastinal shift.  IMPRESSION: Increasing loculated left pleural effusion.  Unchanged rightward mediastinal shift associated with mass effect from loculated left pleural effusion.   Original Report Authenticated By: Andreas Newport, M.D.   Dg Chest Port 1 View  03/02/2013   *RADIOLOGY REPORT*  Clinical  Data: Post VATS, empyema  PORTABLE CHEST - 1 VIEW  Comparison: Portable exam 0723 hours compared 03/01/2013  Findings: Left thoracostomy tubes unchanged. Enlargement of cardiac silhouette. Persistent pleural thickening and/or effusion and left hemithorax with associated atelectasis and consolidation left lung. Right lung clear. Left subclavian line tip projects over SVC. No pneumothorax.  IMPRESSION: No interval change.   Original Report Authenticated By: Ulyses Southward, M.D.   Dg Chest Portable 1 View  03/01/2013   *RADIOLOGY REPORT*  Clinical Data: Postop VATS.  PORTABLE CHEST - 1 VIEW  Comparison: 03/01/2013  Findings: Interval placement of left chest tube and left central line.  The tip of the central line is in the upper SVC.  No pneumothorax.  Decreasing left pleural effusion with slight improvement in aeration within the left lung.  Continued diffuse left lung airspace disease.  Cardiomegaly.  Right lung is clear.  IMPRESSION: Interval placement of left chest tube or with decreasing left effusion.  No pneumothorax.  Continued left lung airspace disease.   Original Report Authenticated By: Charlett Nose, M.D.     Assessment/Plan: S/P Procedure(s) (LRB): VIDEO BRONCHOSCOPY (N/A) VIDEO ASSISTED THORACOSCOPY (VATS)/EMPYEMA (Left) Mobilize Diuresis Plan for transfer to step-down: see transfer orders     Aydrien Froman B 03/02/2013 10:20 AM

## 2013-03-02 NOTE — Care Management Note (Signed)
    Page 1 of 1   03/02/2013     2:02:15 PM   CARE MANAGEMENT NOTE 03/02/2013  Patient:  AUDEL, COAKLEY   Account Number:  [id number]  Date Initiated:  03/02/2013  Documentation initiated by:  Avie Arenas  Subjective/Objective Assessment:   POST op drainage of empyema.     Action/Plan:   Anticipated DC Date:  03/07/2013   Anticipated DC Plan:  HOME W HOME HEALTH SERVICES      DC Planning Services  CM consult      Choice offered to / List presented to:             Status of service:  In process, will continue to follow Medicare Important Message given?   (If response is "NO", the following Medicare IM given date fields will be blank) Date Medicare IM given:   Date Additional Medicare IM given:    Discharge Disposition:    Per UR Regulation:  Reviewed for med. necessity/level of care/duration of stay  If discussed at Long Length of Stay Meetings, dates discussed:    Comments:  Contact:  [place] Father [phone]  03-02-13 11am Avie Arenas, RNBSN [phone] Patient from [facility] - visiting father - who is in room. Discharge plan is to go home with parents when able.

## 2013-03-02 NOTE — Consult Note (Signed)
[facility]  Total days of antibiotics 2        Day 1 ceftriaxone        Day 2 piptazo        Day 2 vanco       Reason for Consult:empyema    Referring Physician: gerhardt  Active Problems:   Other emphysema    HPI: Alexander Gray is a 52 y.o. male with history of emphysema who recently stopped smoking in February who reports having chest discomfort, akin to having a pinch nerve that radiated to left flank and left neck. He denied cough, chest pain, DOE, fever, nightsweats but perhaps had chills over this course of time.did subscribe to increase fatigue/weakness and pedal edema. He states that he started to notice it increasing in frequency to the point unable to do normal activity. He took excessive nsaids to treat pain. He is visiting from [place] to do remodeling for his parents however went to the [facility] ED for evaluation on 6/18 where  CT scan of the chest showed a large left complex pleural effusion s/p thoracentesis with fluid removed. cx grew viridans strep group. Placed on levofloxacin. He denies any exposure to tuberculosis no recent foreign travel. Quit alcohol abuse/dependence greater than a year ago. He denies working with asbestos. He did have significant dental work back in Fall of 2013 and preparing for further dental work, however, none in the last 2 months. Suffers from anxiety, taking increasing doses of xanax. He is POD #0 for LVAT, decortication and drainage of empyema. Wbc of 14.7 with elevated plt of 575 on admit labs   Past Medical History  Diagnosis Date  . Hemoptysis     2009  . Pleural effusion, left     02/28/13  . S/P thoracentesis 02/23/13    left  . Gastritis 2009    vomiting blood  . Shortness of breath     Hx: of  . Kidney stones     Hx: of  . GERD (gastroesophageal reflux disease)   . Seizures     hx: of     Allergies: No Known Allergies  MEDICATIONS: . bisacodyl  10 mg Oral Daily  . cefTRIAXone  (ROCEPHIN)  IV  2 g Intravenous Q24H  . cholecalciferol  2,000 Units Oral Daily  . enoxaparin  30 mg Subcutaneous Q24H  . fentaNYL   Intravenous Q4H  . levalbuterol  0.63 mg Nebulization Q6H  . multivitamin with minerals  1 tablet Oral Daily  . vitamin B-12  1,000 mcg Oral Daily  . vitamin C  500 mg Oral Daily    History  Substance Use Topics  . Smoking status: Former Smoker -- 1.00 packs/day for 35 years    Types: Cigarettes    Quit date: 09/30/2012  . Smokeless tobacco: Never Used     Comment: vapor for 6 months  . Alcohol Use: No    History reviewed. No pertinent family history.   Review of Systems  Constitutional: +fatigue, decrease activity. Negative for fever, chills, diaphoresis, appetite change, fatigue and unexpected weight change.  HENT: Negative for congestion, sore throat, rhinorrhea, sneezing, trouble swallowing and sinus pressure.  Eyes: Negative for photophobia and visual disturbance.  Respiratory: per hpi  Cardiovascular: Negative for chest pain, palpitations and leg swelling.  Gastrointestinal: Negative for nausea, vomiting, abdominal pain, diarrhea, constipation, blood in stool, abdominal distention and anal bleeding.  Genitourinary: Negative for dysuria, hematuria, flank pain and difficulty urinating.  Musculoskeletal:  Negative for myalgias, back pain, joint swelling, arthralgias and gait problem.  Skin: Negative for color change, pallor, rash and wound.  Neurological: Negative for dizziness, tremors, weakness and light-headedness.  Hematological: Negative for adenopathy. Does not bruise/bleed easily.  Psychiatric/Behavioral: Negative for behavioral problems, confusion, sleep disturbance, dysphoric mood, decreased concentration and agitation.     OBJECTIVE: Temp:  [97.2 F (36.2 C)-98.4 F (36.9 C)] 97.2 F (36.2 C) (06/25 1516) Pulse Rate:  [68-112] 79 (06/25 1516) Resp:  [11-28] 21 (06/25 1516) BP: (80-142)/(42-75) 109/51 mmHg (06/25 1516) SpO2:   [91 %-100 %] 91 % (06/25 1516) Arterial Line BP: (103-167)/(45-92) 135/58 mmHg (06/25 1200) FiO2 (%):  [98 %-99 %] 99 % (06/25 1300) Weight:  [194 lb 0.1 oz (88 kg)] 194 lb 0.1 oz (88 kg) (06/25 0600)  Physical Exam  Constitutional: He is oriented to person, place, and time. He appears well-developed and well-nourished. No distress.  HENT:  Mouth/Throat: Oropharynx is clear and moist. No oropharyngeal exudate. Left ij Cardiovascular: Normal rate, regular rhythm and normal heart sounds. Exam reveals no gallop and no friction rub.  No murmur heard.  Pulmonary/Chest: Effort normal and breath sounds normal. No respiratory distress. He has no wheezes. Decrease breath sounds on left hemithorax, chest tubes in place Abdominal: Soft. Bowel sounds are normal. He exhibits no distension. There is no tenderness.  Lymphadenopathy:  no cervical adenopathy.  Neurological: He is alert and oriented to person, place, and time.  Skin: Skin is warm and dry. No rash noted. No erythema.  Ext: trace edema on lower extremities bilaterally Psychiatric: He has a normal mood and affect. His behavior is normal.    LABS: Results for orders placed during the hospital encounter of 03/01/13 (from the past 48 hour(s))  URINALYSIS, ROUTINE W REFLEX MICROSCOPIC     Status: Abnormal   Collection Time    03/01/13 12:43 PM      Result Value Range   Color, Urine AMBER (*) YELLOW   Comment: BIOCHEMICALS MAY BE AFFECTED BY COLOR   APPearance CLEAR  CLEAR   Specific Gravity, Urine 1.042 (*) 1.005 - 1.030   pH 5.5  5.0 - 8.0   Glucose, UA NEGATIVE  NEGATIVE mg/dL   Hgb urine dipstick NEGATIVE  NEGATIVE   Bilirubin Urine NEGATIVE  NEGATIVE   Ketones, ur NEGATIVE  NEGATIVE mg/dL   Protein, ur NEGATIVE  NEGATIVE mg/dL   Urobilinogen, UA 0.2  0.0 - 1.0 mg/dL   Nitrite NEGATIVE  NEGATIVE   Leukocytes, UA NEGATIVE  NEGATIVE   Comment: MICROSCOPIC NOT DONE ON URINES WITH NEGATIVE PROTEIN, BLOOD, LEUKOCYTES, NITRITE, OR GLUCOSE  <1000 mg/dL.  SURGICAL PCR SCREEN     Status: None   Collection Time    03/01/13  1:14 PM      Result Value Range   MRSA, PCR NEGATIVE  NEGATIVE   Staphylococcus aureus NEGATIVE  NEGATIVE   Comment:            The Xpert SA Assay (FDA     approved for NASAL specimens     in patients over 44 years of age),     is one component of     a comprehensive surveillance     program.  Test performance has     been validated by [employer] for patients greater     than or equal to 27 year old.     It is not intended     to diagnose infection nor to  guide or monitor treatment.  APTT     Status: Abnormal   Collection Time    03/01/13  1:14 PM      Result Value Range   aPTT 43 (*) 24 - 37 seconds   Comment:            IF BASELINE aPTT IS ELEVATED,     SUGGEST PATIENT RISK ASSESSMENT     BE USED TO DETERMINE APPROPRIATE     ANTICOAGULANT THERAPY.  BLOOD GAS, ARTERIAL     Status: Abnormal   Collection Time    03/01/13  1:14 PM      Result Value Range   FIO2 0.21     pH, Arterial 7.410  7.350 - 7.450   pCO2 arterial 44.4  35.0 - 45.0 mmHg   pO2, Arterial 59.0 (*) 80.0 - 100.0 mmHg   Bicarbonate 27.6 (*) 20.0 - 24.0 mEq/L   TCO2 28.9  0 - 100 mmol/L   Acid-Base Excess 3.3 (*) 0.0 - 2.0 mmol/L   O2 Saturation 90.5     Patient temperature 98.6     Collection site LEFT RADIAL     Drawn by [id number]     Sample type ARTERIAL DRAW     Allens test (pass/fail) PASS  PASS  CBC     Status: Abnormal   Collection Time    03/01/13  1:14 PM      Result Value Range   WBC 18.7 (*) 4.0 - 10.5 K/uL   RBC 3.44 (*) 4.22 - 5.81 MIL/uL   Hemoglobin 9.5 (*) 13.0 - 17.0 g/dL   HCT 04.5 (*) 40.9 - 81.1 %   MCV 82.3  78.0 - 100.0 fL   MCH 27.6  26.0 - 34.0 pg   MCHC 33.6  30.0 - 36.0 g/dL   RDW 91.4  78.2 - 95.6 %   Platelets 583 (*) 150 - 400 K/uL  COMPREHENSIVE METABOLIC PANEL     Status: Abnormal   Collection Time    03/01/13  1:14 PM      Result Value Range   Sodium 133 (*) 135 - 145  mEq/L   Potassium 4.4  3.5 - 5.1 mEq/L   Chloride 97  96 - 112 mEq/L   CO2 25  19 - 32 mEq/L   Glucose, Bld 91  70 - 99 mg/dL   BUN 14  6 - 23 mg/dL   Creatinine, Ser 2.13  0.50 - 1.35 mg/dL   Calcium 8.7  8.4 - 08.6 mg/dL   Total Protein 6.3  6.0 - 8.3 g/dL   Albumin 1.9 (*) 3.5 - 5.2 g/dL   AST 19  0 - 37 U/L   ALT 13  0 - 53 U/L   Alkaline Phosphatase 92  39 - 117 U/L   Total Bilirubin 0.2 (*) 0.3 - 1.2 mg/dL   GFR calc non Af Amer >90  >90 mL/min   GFR calc Af Amer >90  >90 mL/min   Comment:            The eGFR has been calculated     using the CKD EPI equation.     This calculation has not been     validated in all clinical     situations.     eGFR's persistently     <90 mL/min signify     possible Chronic Kidney Disease.  PROTIME-INR     Status: None   Collection Time    03/01/13  1:14 PM  Result Value Range   Prothrombin Time 14.1  11.6 - 15.2 seconds   INR 1.10  0.00 - 1.49  TYPE AND SCREEN     Status: None   Collection Time    03/01/13  1:15 PM      Result Value Range   ABO/RH(D) O POS     Antibody Screen NEG     Sample Expiration 03/04/2013    ABO/RH     Status: None   Collection Time    03/01/13  1:15 PM      Result Value Range   ABO/RH(D) O POS    AFB CULTURE WITH SMEAR     Status: None   Collection Time    03/01/13  4:11 PM      Result Value Range   Specimen Description BRONCHIAL WASHINGS     Special Requests NONE     ACID FAST SMEAR NO ACID FAST BACILLI SEEN     Culture       Value: CULTURE WILL BE EXAMINED FOR 6 WEEKS BEFORE ISSUING A FINAL REPORT   Report Status PENDING    CULTURE, RESPIRATORY (NON-EXPECTORATED)     Status: None   Collection Time    03/01/13  4:11 PM      Result Value Range   Specimen Description BRONCHIAL WASHINGS     Special Requests NONE     Gram Stain       Value: MODERATE WBC PRESENT, PREDOMINANTLY PMN     NO SQUAMOUS EPITHELIAL CELLS SEEN     NO ORGANISMS SEEN   Culture PENDING     Report Status PENDING    AFB  CULTURE WITH SMEAR     Status: None   Collection Time    03/01/13  4:53 PM      Result Value Range   Specimen Description PLEURAL FLUID LEFT     Special Requests PATIENT ON FOLLOWING ZINACEF     ACID FAST SMEAR NO ACID FAST BACILLI SEEN     Culture       Value: CULTURE WILL BE EXAMINED FOR 6 WEEKS BEFORE ISSUING A FINAL REPORT   Report Status PENDING    BODY FLUID CULTURE     Status: None   Collection Time    03/01/13  4:53 PM      Result Value Range   Specimen Description PLEURAL FLUID LEFT     Special Requests PT ON ZINACEF     Gram Stain       Value: ABUNDANT WBC PRESENT, PREDOMINANTLY PMN     NO ORGANISMS SEEN   Culture PENDING     Report Status PENDING    BODY FLUID CELL COUNT WITH DIFFERENTIAL     Status: Abnormal   Collection Time    03/01/13  5:00 PM      Result Value Range   Fluid Type-FCT PLEURAL     Comment: FLUID     LEFT   Color, Fluid AMBER (*) YELLOW   Appearance, Fluid TURBID (*) CLEAR   WBC, Fluid 82956 (*) 0 - 1000 cu mm   Neutrophil Count, Fluid 95 (*) 0 - 25 %   Lymphs, Fluid 3     Monocyte-Macrophage-Serous Fluid 2 (*) 50 - 90 %  PROTEIN, BODY FLUID     Status: None   Collection Time    03/01/13  5:00 PM      Result Value Range   Total protein, fluid 4.7     Comment: NO NORMAL RANGE ESTABLISHED FOR  THIS TEST   Fluid Type-FTP PLEURAL     Comment: FLUID     LEFT  GLUCOSE, SEROUS FLUID     Status: None   Collection Time    03/01/13  5:00 PM      Result Value Range   Glucose, Fluid <20     Comment:            FLUID GLUCOSE LEVELS OF <60     mg/dL OR VALUES OF 40 mg/dL     LESS THAN A SIMULTANEOUS     SERUM LEVEL ARE CONSIDERED     DECREASED.   Fluid Type-FGLU PLEURAL     Comment: FLUID     LEFT  LACTATE DEHYDROGENASE, BODY FLUID     Status: Abnormal   Collection Time    03/01/13  5:00 PM      Result Value Range   LD, Fluid 6748 (*) 3 - 23 U/L   Comment: RESULTS CONFIRMED BY MANUAL DILUTION   Fluid Type-FLDH PLEURAL     Comment: FLUID      LEFT  PATHOLOGIST SMEAR REVIEW     Status: None   Collection Time    03/01/13  5:00 PM      Result Value Range   Path Review ABUNDANT PMN's WITH NO ORGANISMS     Comment: SEEN. CORRELATE WITH GRAM STAIN AND     CULTURE.     Reviewed by Coralyn Pear, M.D.     03/02/13  CBC     Status: Abnormal   Collection Time    03/02/13  4:50 AM      Result Value Range   WBC 14.7 (*) 4.0 - 10.5 K/uL   RBC 3.37 (*) 4.22 - 5.81 MIL/uL   Hemoglobin 9.2 (*) 13.0 - 17.0 g/dL   HCT 16.1 (*) 09.6 - 04.5 %   MCV 83.1  78.0 - 100.0 fL   MCH 27.3  26.0 - 34.0 pg   MCHC 32.9  30.0 - 36.0 g/dL   RDW 40.9  81.1 - 91.4 %   Platelets 575 (*) 150 - 400 K/uL  BASIC METABOLIC PANEL     Status: Abnormal   Collection Time    03/02/13  4:50 AM      Result Value Range   Sodium 133 (*) 135 - 145 mEq/L   Potassium 4.2  3.5 - 5.1 mEq/L   Chloride 97  96 - 112 mEq/L   CO2 29  19 - 32 mEq/L   Glucose, Bld 105 (*) 70 - 99 mg/dL   BUN 9  6 - 23 mg/dL   Creatinine, Ser 7.82  0.50 - 1.35 mg/dL   Calcium 7.9 (*) 8.4 - 10.5 mg/dL   GFR calc non Af Amer >90  >90 mL/min   GFR calc Af Amer >90  >90 mL/min   Comment:            The eGFR has been calculated     using the CKD EPI equation.     This calculation has not been     validated in all clinical     situations.     eGFR's persistently     <90 mL/min signify     possible Chronic Kidney Disease.  POCT I-STAT 3, BLOOD GAS (G3+)     Status: Abnormal   Collection Time    03/02/13  4:50 AM      Result Value Range   pH, Arterial 7.400  7.350 - 7.450  pCO2 arterial 47.1 (*) 35.0 - 45.0 mmHg   pO2, Arterial 73.0 (*) 80.0 - 100.0 mmHg   Bicarbonate 29.2 (*) 20.0 - 24.0 mEq/L   TCO2 31  0 - 100 mmol/L   O2 Saturation 94.0     Acid-Base Excess 4.0 (*) 0.0 - 2.0 mmol/L   Patient temperature 98.4 F     Collection site ARTERIAL LINE     Drawn by Nurse     Sample type ARTERIAL      MICRO: 6/18 pleural fluid = viridans strep 6/24 resp culture =  pending  IMAGING: Dg Chest 2 View  03/01/2013   *RADIOLOGY REPORT*  Clinical Data: Preoperative radiograph.  Bronchoscopy today.  CHEST - 2 VIEW  Comparison: Radiograph 02/23/2013.  CT 02/21/2013.  Findings: Increasing left pleural effusion is present.  Very little left lung remains aerated.  Rightward mediastinal shift.  Old right rib fractures.  No airspace disease in the right lung.  Rightward tracheal deviation. Effusion is loculated with a substantial anterior component identified on the lateral view. Cardiopericardial silhouette appears similar to the prior exam allowing for mediastinal shift.  IMPRESSION: Increasing loculated left pleural effusion.  Unchanged rightward mediastinal shift associated with mass effect from loculated left pleural effusion.   Original Report Authenticated By: Andreas Newport, M.D.   Dg Chest Port 1 View  03/02/2013   *RADIOLOGY REPORT*  Clinical Data: Post VATS, empyema  PORTABLE CHEST - 1 VIEW  Comparison: Portable exam 0723 hours compared 03/01/2013  Findings: Left thoracostomy tubes unchanged. Enlargement of cardiac silhouette. Persistent pleural thickening and/or effusion and left hemithorax with associated atelectasis and consolidation left lung. Right lung clear. Left subclavian line tip projects over SVC. No pneumothorax.  IMPRESSION: No interval change.   Original Report Authenticated By: Ulyses Southward, M.D.   Dg Chest Portable 1 View  03/01/2013   *RADIOLOGY REPORT*  Clinical Data: Postop VATS.  PORTABLE CHEST - 1 VIEW  Comparison: 03/01/2013  Findings: Interval placement of left chest tube and left central line.  The tip of the central line is in the upper SVC.  No pneumothorax.  Decreasing left pleural effusion with slight improvement in aeration within the left lung.  Continued diffuse left lung airspace disease.  Cardiomegaly.  Right lung is clear.  IMPRESSION: Interval placement of left chest tube or with decreasing left effusion.  No pneumothorax.  Continued  left lung airspace disease.   Original Report Authenticated By: Charlett Nose, M.D.   Assessment/Plan:  52yo M with strep viridans empyema s/p VATS, decortication and drainage. Currently on vanco and piptazo. Source possibly due to poor dentition  - will change antibiotics to ceftriaxone 2gm IV daily - will check blood cultures - will follow up with pleural fluid culture from surgery to see if any other pathogens identified other than viridans strep - if evidence of bacteremia, would recommend to get TTE. Await culture results for now  Thank you for consultation,  Aram Beecham B. Drue Second MD MPH [facility] [phone]

## 2013-03-03 ENCOUNTER — Encounter: Payer: Self-pay | Admitting: Cardiothoracic Surgery

## 2013-03-03 ENCOUNTER — Inpatient Hospital Stay (HOSPITAL_COMMUNITY): Payer: MEDICAID

## 2013-03-03 LAB — COMPREHENSIVE METABOLIC PANEL
ALT: 10 U/L (ref 0–53)
Albumin: 1.5 g/dL — ABNORMAL LOW (ref 3.5–5.2)
Calcium: 7.9 mg/dL — ABNORMAL LOW (ref 8.4–10.5)
GFR calc Af Amer: >90 mL/min (ref >90)
Glucose, Bld: 109 mg/dL — ABNORMAL HIGH (ref 70–99)
Sodium: 129 mEq/L — ABNORMAL LOW (ref 135–145)
Total Protein: 5.1 g/dL — ABNORMAL LOW (ref 6.0–8.3)

## 2013-03-03 LAB — CBC
Hemoglobin: 9.1 g/dL — ABNORMAL LOW (ref 13.0–17.0)
MCH: 27.4 pg (ref 26.0–34.0)
MCHC: 32.7 g/dL (ref 30.0–36.0)
RDW: 14.7 % (ref 11.5–15.5)

## 2013-03-03 MED ORDER — GUAIFENESIN ER 600 MG PO TB12
600.0000 mg | ORAL_TABLET | Freq: Two times a day (BID) | ORAL | Status: DC
  Administered 2013-03-03 – 2013-03-07 (×9): 600 mg via ORAL
  Filled 2013-03-03 (×10): qty 1

## 2013-03-03 MED ORDER — FENTANYL 10 MCG/ML IV SOLN
Freq: Once | INTRAVENOUS | Status: DC
  Administered 2013-03-03: 255 ug via INTRAVENOUS
  Filled 2013-03-03: qty 50

## 2013-03-03 MED ORDER — ALUM & MAG HYDROXIDE-SIMETH 200-200-20 MG/5ML PO SUSP
30.0000 mL | Freq: Three times a day (TID) | ORAL | Status: DC | PRN
  Administered 2013-03-03 – 2013-03-04 (×5): 30 mL via ORAL
  Filled 2013-03-03 (×5): qty 30

## 2013-03-03 NOTE — Progress Notes (Signed)
Middle chest tube removed, patient tolerated without difficulty.  Contnue to watch.

## 2013-03-03 NOTE — Progress Notes (Addendum)
[address]       [name] [address]             [phone]          2 Days Post-Op Procedure(s) (LRB): VIDEO BRONCHOSCOPY (N/A) VIDEO ASSISTED THORACOSCOPY (VATS)/EMPYEMA (Left)  Subjective: Sore at CT site with cough, otherwise stable.  Walked in halls earlier and felt good, breathing stable.   Objective: Vital signs in last 24 hours: Patient Vitals for the past 24 hrs:  BP Temp Temp src Pulse Resp SpO2  03/03/13 0342 - - - - 23 94 %  03/03/13 0332 123/52 mmHg 99.7 F (37.6 C) Oral 88 20 92 %  03/03/13 0000 109/60 mmHg 99 F (37.2 C) Oral 99 15 93 %  03/02/13 2042 - - - - 16 93 %  03/02/13 2035 - - - - - 98 %  03/02/13 2000 123/57 mmHg 100.6 F (38.1 C) Oral 96 19 92 %  03/02/13 1516 109/51 mmHg 97.2 F (36.2 C) Oral 79 21 91 %  03/02/13 1515 - - - 83 25 99 %  03/02/13 1500 - - - - - 100 %  03/02/13 1424 131/70 mmHg 97.4 F (36.3 C) Oral 104 18 92 %  03/02/13 1300 107/57 mmHg - - 68 16 -  03/02/13 1200 104/61 mmHg - - 72 15 100 %  03/02/13 1135 - 97.4 F (36.3 C) Oral - - -  03/02/13 1100 110/57 mmHg - - 69 15 99 %  03/02/13 1043 - - - - 14 98 %  03/02/13 1000 114/53 mmHg - - 78 14 99 %  03/02/13 0900 94/42 mmHg - - 69 12 99 %  03/02/13 0816 - 97.8 F (36.6 C) Oral - - -  03/02/13 0800 108/56 mmHg - - 77 15 99 %  03/02/13 0751 111/48 mmHg - - 75 16 95 %   Current Weight  03/02/13 194 lb 0.1 oz (88 kg)     Intake/Output from previous day: 06/25 0701 - 06/26 0700 In: 2123.5 [P.O.:780; I.V.:1281; IV Piggyback:62.5] Out: 1795 [Urine:1575; Chest Tube:220]    PHYSICAL EXAM:  Heart: RRR Lungs: Decreased BS in L base Wound: Clean and dry Chest tube: no air leak    Lab Results: CBC: Recent Labs  03/02/13 0450 03/03/13 0350  WBC 14.7* 14.9*  HGB 9.2* 9.1*  HCT 28.0* 27.8*  PLT 575* 633*   BMET:  Recent Labs  03/02/13 0450 03/03/13 0350  NA 133* 129*  K 4.2 3.9  CL 97 92*  CO2 29 32  GLUCOSE 105* 109*  BUN 9  7  CREATININE 0.64 0.68  CALCIUM 7.9* 7.9*    PT/INR:  Recent Labs  03/01/13 1314  LABPROT 14.1  INR 1.10   CXR: Findings: Left chest tubes remain and no pneumothorax is seen. There is a probable small left effusion present. A left central venous line tip overlies the upper SVC and the right lung is clear. Cardiomegaly is stable.  IMPRESSION:  No change in postoperative changes on the left with chest tubes and volume loss with effusion. No pneumothorax.    Assessment/Plan: S/P Procedure(s) (LRB): VIDEO BRONCHOSCOPY (N/A) VIDEO ASSISTED THORACOSCOPY (VATS)/EMPYEMA (Left) CT drainage still high, but no air leak and CXR stable. May consider d/c 1 CT soon. Continue pulm toilet, ambulation.  ID-Blood cx's pending.  Continue Rocephin for strep viridans.   LOS: 2 days    COLLINS,GINA H 03/03/2013  I have seen and examined Alexander Burdock  Gray and agree with the above assessment  and plan.  Delight Ovens MD Beeper [phone] Office [phone] 03/03/2013 8:49 AM

## 2013-03-03 NOTE — Progress Notes (Signed)
[facility]    Date of Admission:  03/01/2013   Total days of antibiotics 3        Day 2 cetriaxone        ( 1 day of piptazo and vanco)           ID: Alexander Gray is a 52 y.o. male with  Viridans strep empyema s/p LEFT  VAT decortication  Active Problems:   Other emphysema    Subjective: Afebrile, 100.6 tmax last night. Feeling better presently  Medications:  . bisacodyl  10 mg Oral Daily  . cefTRIAXone (ROCEPHIN)  IV  2 g Intravenous Q24H  . cholecalciferol  2,000 Units Oral Daily  . enoxaparin  30 mg Subcutaneous Q24H  . fentaNYL   Intravenous Q4H  . fentaNYL   Intravenous Once  . guaiFENesin  600 mg Oral BID  . levalbuterol  0.63 mg Nebulization TID  . multivitamin with minerals  1 tablet Oral Daily  . vitamin B-12  1,000 mcg Oral Daily  . vitamin C  500 mg Oral Daily    Objective: Vital signs in last 24 hours: Temp:  [97.2 F (36.2 C)-100.6 F (38.1 C)] 97.6 F (36.4 C) (06/26 1131) Pulse Rate:  [75-104] 90 (06/26 1132) Resp:  [13-25] 13 (06/26 1154) BP: (101-131)/(51-70) 113/59 mmHg (06/26 1131) SpO2:  [90 %-100 %] 94 % (06/26 1154) FiO2 (%):  [94 %] 94 % (06/26 0922)  Constitutional: He is oriented to person, place, and time. He appears well-developed and well-nourished. No distress.  HENT:  Mouth/Throat: Oropharynx is clear and moist. No oropharyngeal exudate. Left ij  Cardiovascular: Normal rate, regular rhythm and normal heart sounds. Exam reveals no gallop and no friction rub.  No murmur heard.  Pulmonary/Chest: Effort normal and breath sounds normal. No respiratory distress. He has no wheezes. Decrease breath sounds on left hemithorax, chest tubes in place  Abdominal: Soft. Bowel sounds are normal. He exhibits no distension. There is no tenderness.  Lymphadenopathy: no cervical adenopathy.  Neurological: He is alert and oriented to person, place, and time.  Skin: Skin is warm and dry. No rash noted. No erythema.  Ext:  trace edema on lower extremities bilaterally Psychiatric: He has a normal mood and affect. His behavior is normal.   Lab Results  Recent Labs  03/02/13 0450 03/03/13 0350  WBC 14.7* 14.9*  HGB 9.2* 9.1*  HCT 28.0* 27.8*  NA 133* 129*  K 4.2 3.9  CL 97 92*  CO2 29 32  BUN 9 7  CREATININE 0.64 0.68   Liver Panel  Recent Labs  03/01/13 1314 03/03/13 0350  PROT 6.3 5.1*  ALBUMIN 1.9* 1.5*  AST 19 20  ALT 13 10  ALKPHOS 92 76  BILITOT 0.2* 0.2*    Microbiology: 6/25 blood cx NGTD 6/24 bronchial wash NOPF 6/24 pleural fluid NGTD 6/18 viridans strep Studies/Results: Dg Chest Port 1 View  03/03/2013   *RADIOLOGY REPORT*  Clinical Data: Chest tube, follow-up  PORTABLE CHEST - 1 VIEW  Comparison: Portable chest x-ray of 03/02/2013  Findings: Left chest tubes remain and no pneumothorax is seen. There is a probable small left effusion present.  A left central venous line tip overlies the upper SVC and the right lung is clear. Cardiomegaly is stable.  IMPRESSION: No change in postoperative changes on the left with chest tubes and volume loss with effusion.  No pneumothorax.   Original Report Authenticated By: Dwyane Dee, M.D.   Dg Chest  Port 1 View  03/02/2013   *RADIOLOGY REPORT*  Clinical Data: Post VATS, empyema  PORTABLE CHEST - 1 VIEW  Comparison: Portable exam 0723 hours compared 03/01/2013  Findings: Left thoracostomy tubes unchanged. Enlargement of cardiac silhouette. Persistent pleural thickening and/or effusion and left hemithorax with associated atelectasis and consolidation left lung. Right lung clear. Left subclavian line tip projects over SVC. No pneumothorax.  IMPRESSION: No interval change.   Original Report Authenticated By: Ulyses Southward, M.D.   Dg Chest Portable 1 View  03/01/2013   *RADIOLOGY REPORT*  Clinical Data: Postop VATS.  PORTABLE CHEST - 1 VIEW  Comparison: 03/01/2013  Findings: Interval placement of left chest tube and left central line.  The tip of the  central line is in the upper SVC.  No pneumothorax.  Decreasing left pleural effusion with slight improvement in aeration within the left lung.  Continued diffuse left lung airspace disease.  Cardiomegaly.  Right lung is clear.  IMPRESSION: Interval placement of left chest tube or with decreasing left effusion.  No pneumothorax.  Continued left lung airspace disease.   Original Report Authenticated By: Charlett Nose, M.D.     Assessment/Plan: 51yo strep viridans empyema s/p VAT decortication on ceftriaxone  - continue with ceftriaxone 2gm IV daily - will get panarox of teeth to see if possible source of infection - will see if blood culture remain negative tomorrow so that we can get picc line placed late Friday/saturday for Iv antibiotics  Alexander Gray, [facility] for Infect[facility] Diseases Cell: [phone] Pager: [phone]  03/03/2013, 1:57 PM

## 2013-03-04 ENCOUNTER — Inpatient Hospital Stay (HOSPITAL_COMMUNITY): Payer: MEDICAID

## 2013-03-04 LAB — CBC WITH DIFFERENTIAL/PLATELET
Basophils Relative: 0 % (ref 0–1)
Eosinophils Relative: 3 % (ref 0–5)
Hemoglobin: 8.9 g/dL — ABNORMAL LOW (ref 13.0–17.0)
MCH: 27.7 pg (ref 26.0–34.0)
MCV: 84.7 fL (ref 78.0–100.0)
Monocytes Absolute: 0.7 10*3/uL (ref 0.1–1.0)
Neutrophils Relative %: 79 % — ABNORMAL HIGH (ref 43–77)
RBC: 3.21 MIL/uL — ABNORMAL LOW (ref 4.22–5.81)

## 2013-03-04 LAB — BASIC METABOLIC PANEL
CO2: 33 mEq/L — ABNORMAL HIGH (ref 19–32)
Calcium: 8.3 mg/dL — ABNORMAL LOW (ref 8.4–10.5)
Glucose, Bld: 116 mg/dL — ABNORMAL HIGH (ref 70–99)
Sodium: 133 mEq/L — ABNORMAL LOW (ref 135–145)

## 2013-03-04 LAB — CULTURE, RESPIRATORY W GRAM STAIN

## 2013-03-04 MED ORDER — SODIUM CHLORIDE 0.9 % IJ SOLN
10.0000 mL | Freq: Two times a day (BID) | INTRAMUSCULAR | Status: DC
  Administered 2013-03-04 – 2013-03-07 (×6): 10 mL via INTRAVENOUS
  Filled 2013-03-04: qty 20
  Filled 2013-03-04: qty 10

## 2013-03-04 MED ORDER — SODIUM CHLORIDE 0.9 % IJ SOLN
INTRAMUSCULAR | Status: AC
  Administered 2013-03-04: 20 mL
  Filled 2013-03-04: qty 20

## 2013-03-04 MED ORDER — OMEPRAZOLE 20 MG PO CPDR
20.0000 mg | DELAYED_RELEASE_CAPSULE | Freq: Every day | ORAL | Status: DC
Start: 2013-03-04 — End: 2013-03-04
  Administered 2013-03-04: 20 mg via ORAL
  Filled 2013-03-04: qty 1

## 2013-03-04 MED ORDER — OMEPRAZOLE 20 MG PO CPDR
20.0000 mg | DELAYED_RELEASE_CAPSULE | Freq: Every day | ORAL | Status: DC
  Administered 2013-03-05 – 2013-03-07 (×3): 20 mg via ORAL
  Filled 2013-03-04 (×5): qty 1

## 2013-03-04 MED ORDER — NON FORMULARY: 20.0000 mg | Freq: Every day | Status: DC

## 2013-03-04 MED ORDER — CALCIUM CARBONATE ANTACID 500 MG PO CHEW
1.0000 | CHEWABLE_TABLET | ORAL | Status: DC | PRN
  Filled 2013-03-04: qty 1

## 2013-03-04 MED ORDER — PANTOPRAZOLE SODIUM 40 MG PO TBEC
40.0000 mg | DELAYED_RELEASE_TABLET | Freq: Every day | ORAL | Status: DC
  Administered 2013-03-04: 40 mg via ORAL
  Filled 2013-03-04: qty 1

## 2013-03-04 MED ORDER — SODIUM CHLORIDE 0.9 % IJ SOLN: 10.0000 mL | INTRAMUSCULAR | Status: DC | PRN

## 2013-03-04 NOTE — Progress Notes (Signed)
Received call from lab about small amount of organism (probably viridans) now seen in body fluid culture).  Dr. Drue Second paged and notified of these results.   Roselie Awkward, RN

## 2013-03-04 NOTE — Progress Notes (Signed)
[facility]    Date of Admission:  03/01/2013   Total days of antibiotics 4        Day 3 cetriaxone        ( 1 day of piptazo and vanco)           ID: Alexander Gray is a 52 y.o. 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 panorex did not show any dental abnormality  Medications:  . bisacodyl  10 mg Oral Daily  . cefTRIAXone (ROCEPHIN)  IV  2 g Intravenous Q24H  . cholecalciferol  2,000 Units Oral Daily  . enoxaparin  30 mg Subcutaneous Q24H  . fentaNYL   Intravenous Q4H  . fentaNYL   Intravenous Once  . guaiFENesin  600 mg Oral BID  . levalbuterol  0.63 mg Nebulization TID  . multivitamin with minerals  1 tablet Oral Daily  . pantoprazole  40 mg Oral Daily  . vitamin B-12  1,000 mcg Oral Daily  . vitamin C  500 mg Oral Daily    Objective: Vital signs in last 24 hours: Temp:  [97.6 F (36.4 C)-98.8 F (37.1 C)] 97.6 F (36.4 C) (06/27 0800) Pulse Rate:  [73-90] 79 (06/27 0800) Resp:  [8-22] 8 (06/27 0800) BP: (103-122)/(51-64) 116/51 mmHg (06/27 0800) SpO2:  [91 %-98 %] 97 % (06/27 0823) FiO2 (%):  [100 %] 100 % (06/26 2027)  Constitutional: He is oriented to person, place, and time. He appears well-developed and well-nourished. No distress.  HENT:  Mouth/Throat: Oropharynx is clear and moist. No oropharyngeal exudate. Left ij  Cardiovascular: Normal rate, regular rhythm and normal heart sounds. Exam reveals no gallop and no friction rub.  No murmur heard.  Pulmonary/Chest: Effort normal and breath sounds normal. No respiratory distress. He has no wheezes. Decrease breath sounds on left hemithorax, chest tubes in place  Abdominal: Soft. Bowel sounds are normal. He exhibits no distension. There is no tenderness.  Lymphadenopathy: no cervical adenopathy.  Neurological: He is alert and oriented to person, place, and time.  Skin: Skin is warm and dry. No rash  noted. No erythema.  Ext: trace edema on lower extremities bilaterally Psychiatric: He has a normal mood and affect. His behavior is normal.   Lab Results  Recent Labs  03/02/13 0450 03/03/13 0350  WBC 14.7* 14.9*  HGB 9.2* 9.1*  HCT 28.0* 27.8*  NA 133* 129*  K 4.2 3.9  CL 97 92*  CO2 29 32  BUN 9 7  CREATININE 0.64 0.68   Liver Panel  Recent Labs  03/01/13 1314 03/03/13 0350  PROT 6.3 5.1*  ALBUMIN 1.9* 1.5*  AST 19 20  ALT 13 10  ALKPHOS 92 76  BILITOT 0.2* 0.2*    Microbiology: 6/25 blood cx NGTD 6/24 bronchial wash NOPF 6/24 pleural fluid NGTD 6/18 viridans strep Studies/Results: Dg Orthopantogram  03/04/2013   *RADIOLOGY REPORT*  Clinical Data: Strep viridans empyema.  ORTHOPANTOGRAM/PANORAMIC  Comparison: None.  Findings: There are multiple missing teeth.  However, there is no evidence of periapical abscess or other active infection in the maxilla or mandible.  The maxillary sinuses are clear.  IMPRESSION: No acute abnormalities.   Original Report Authenticated By: Francene Boyers, M.D.   Dg Chest 1 View  03/04/2013   *RADIOLOGY REPORT*  Clinical Data: Status post left VATS procedure to treat empyema.  CHEST - 1 VIEW  Comparison: 03/03/2013  Findings: Stable positioning of left chest tubes without pneumothorax.  Expansion of the left lower lung has improved.  No edema identified.  Heart size is stable.  IMPRESSION: No pneumothorax.  Improved aeration of the left lung.   Original Report Authenticated By: Irish Lack, M.D.   Dg Chest Port 1 View  03/03/2013   *RADIOLOGY REPORT*  Clinical Data: Chest tube, follow-up  PORTABLE CHEST - 1 VIEW  Comparison: Portable chest x-ray of 03/02/2013  Findings: Left chest tubes remain and no pneumothorax is seen. There is a probable small left effusion present.  A left central venous line tip overlies the upper SVC and the right lung is clear. Cardiomegaly is stable.  IMPRESSION: No change in postoperative changes on the left  with chest tubes and volume loss with effusion.  No pneumothorax.   Original Report Authenticated By: Dwyane Dee, M.D.     Assessment/Plan: 51yo strep viridans empyema s/p VAT decortication on ceftriaxone  - continue with ceftriaxone 2gm IV daily would treat with IV antibiotics for 2-3 wk, then convert to orals. - consider getting picc line and removal of IJ when felt close to discharge - will check Cbc, bmp, sed rate and crp today    we will set up appt in ID clinic for follow up  [facility], [facility] Cell: [phone] Pager: [phone]  03/04/2013, 10:09 AM

## 2013-03-04 NOTE — Progress Notes (Addendum)
[address]       [name] [address]             [phone]          3 Days Post-Op Procedure(s) (LRB): VIDEO BRONCHOSCOPY (N/A) VIDEO ASSISTED THORACOSCOPY (VATS)/EMPYEMA (Left)  Subjective: Feels better today.  Had a good walk this am, breathing stable.  Only complaint is acid reflux.  He states that he usually takes Prilosec OTC at home, but forgot to note it on admission.    Objective: Vital signs in last 24 hours: Patient Vitals for the past 24 hrs:  BP Temp Temp src Pulse Resp SpO2  03/04/13 0339 - - - - 22 95 %  03/04/13 0333 103/58 mmHg 98.8 F (37.1 C) Oral 74 12 94 %  03/04/13 0001 - - - - 18 95 %  03/04/13 0000 109/64 mmHg 98.7 F (37.1 C) Oral 84 15 94 %  03/03/13 2300 - - - 84 9 96 %  03/03/13 2027 - - - - - 98 %  03/03/13 1957 - - - - 15 94 %  03/03/13 1951 122/61 mmHg 98.6 F (37 C) Oral 78 15 92 %  03/03/13 1900 - - - 73 13 91 %  03/03/13 1525 - - - - 17 98 %  03/03/13 1500 - 98.3 F (36.8 C) Oral - - -  03/03/13 1154 - - - - 13 94 %  03/03/13 1132 - - - 90 13 94 %  03/03/13 1131 113/59 mmHg 97.6 F (36.4 C) Oral 78 14 94 %  03/03/13 0759 - 97.7 F (36.5 C) Oral - 13 90 %   Current Weight  03/02/13 194 lb 0.1 oz (88 kg)     Intake/Output from previous day: 06/26 0701 - 06/27 0700 In: 1390 [P.O.:1040; I.V.:350] Out: 1045 [Urine:875; Chest Tube:170]    PHYSICAL EXAM:  Heart: RRR Lungs: Decreased BS on L Wound: Dressed and dry Chest tube: No air leak    Lab Results: CBC: Recent Labs  03/02/13 0450 03/03/13 0350  WBC 14.7* 14.9*  HGB 9.2* 9.1*  HCT 28.0* 27.8*  PLT 575* 633*   BMET:  Recent Labs  03/02/13 0450 03/03/13 0350  NA 133* 129*  K 4.2 3.9  CL 97 92*  CO2 29 32  GLUCOSE 105* 109*  BUN 9 7  CREATININE 0.64 0.68  CALCIUM 7.9* 7.9*    PT/INR:  Recent Labs  03/01/13 1314  LABPROT 14.1  INR 1.10   CXR: Findings: Stable positioning of left chest tubes without  pneumothorax.  Expansion of the left lower lung has improved. No edema identified. Heart size is stable.  IMPRESSION:  No pneumothorax. Improved aeration of the left lung.  Orthopantogram: Findings: There are multiple missing teeth. However, there is no evidence of periapical abscess or other active infection in the maxilla or mandible. The maxillary sinuses are clear.  IMPRESSION:  No acute abnormalities   Assessment/Plan: S/P Procedure(s) (LRB): VIDEO BRONCHOSCOPY (N/A) VIDEO ASSISTED THORACOSCOPY (VATS)/EMPYEMA (Left)  CT with no leak, output decreasing.  Will place CT to water seal. Hopefully can d/c another CT soon.  Strep viridans empyema- blood cx's remain negative. Continue Rocephin per ID- will need input on duration of IV abx. Pantogram did not reveal a source for infection.   Will start Protonix for reflux.  Continue ambulation, pulm toilet, wean O2.   LOS: 3 days    COLLINS,GINA H 03/04/2013  I have seen and examined  [place] and agree with the above assessment  and plan.  Delight Ovens MD Beeper [phone] Office [phone] 03/04/2013 11:44 AM

## 2013-03-05 ENCOUNTER — Inpatient Hospital Stay (HOSPITAL_COMMUNITY): Payer: MEDICAID

## 2013-03-05 LAB — C-REACTIVE PROTEIN: CRP: 13.6 mg/dL — ABNORMAL HIGH (ref <0.60)

## 2013-03-05 MED ORDER — LACTULOSE 10 GM/15ML PO SOLN
20.0000 g | Freq: Every day | ORAL | Status: DC | PRN
  Administered 2013-03-06: 20 g via ORAL
  Filled 2013-03-05: qty 30

## 2013-03-05 NOTE — Progress Notes (Addendum)
       [address]       [name] [address]             [phone]          4 Days Post-Op Procedure(s) (LRB): VIDEO BRONCHOSCOPY (N/A) VIDEO ASSISTED THORACOSCOPY (VATS)/EMPYEMA (Left)  Subjective: Feels well, no complaints.  Reflux symptoms improved.   Objective: Vital signs in last 24 hours: Patient Vitals for the past 24 hrs:  BP Temp Temp src Pulse Resp SpO2  03/05/13 0726 - 98.9 F (37.2 C) Oral - - -  03/05/13 0400 115/52 mmHg 98.9 F (37.2 C) Oral - 14 93 %  03/05/13 0000 106/53 mmHg 98.6 F (37 C) Oral 81 16 98 %  03/04/13 1947 115/51 mmHg 99.3 F (37.4 C) Oral 82 18 96 %  03/04/13 1700 - 98.7 F (37.1 C) Oral - - -  03/04/13 1600 128/57 mmHg 98.3 F (36.8 C) Oral 78 20 94 %  03/04/13 1558 - - - - 18 95 %  03/04/13 1415 - - - - - 98 %  03/04/13 1200 119/49 mmHg 98.1 F (36.7 C) Oral 76 17 95 %  03/04/13 0823 - - - - - 97 %  03/04/13 0800 116/51 mmHg 97.6 F (36.4 C) Oral 79 8 93 %  03/04/13 0758 - - - - 15 97 %   Current Weight  03/02/13 194 lb 0.1 oz (88 kg)     Intake/Output from previous day: 06/27 0701 - 06/28 0700 In: 920 [P.O.:440; I.V.:380; IV Piggyback:100] Out: 2910 [Urine:2750; Chest Tube:160]    PHYSICAL EXAM:  Heart: RRR, slightly tachy around 100s Lungs: Diminished BS L base Wound:Clean and dry Chest tube: No air leak    Lab Results: CBC: Recent Labs  03/03/13 0350 03/04/13 1030  WBC 14.9* 13.4*  HGB 9.1* 8.9*  HCT 27.8* 27.2*  PLT 633* 682*   BMET:  Recent Labs  03/03/13 0350 03/04/13 1030  NA 129* 133*  K 3.9 4.0  CL 92* 95*  CO2 32 33*  GLUCOSE 109* 116*  BUN 7 6  CREATININE 0.68 0.61  CALCIUM 7.9* 8.3*    PT/INR: No results found for this basename: LABPROT, INR,  in the last 72 hours    Assessment/Plan: S/P Procedure(s) (LRB): VIDEO BRONCHOSCOPY (N/A) VIDEO ASSISTED THORACOSCOPY (VATS)/EMPYEMA (Left)  Am CXR just done and not in Epic yet.  Will follow up CXR when  available.  CT drainage decreasing, no air leak. Continue to water seal for now.  Strep viridans empyema- D#4 Rocephin. Continue x 2-3 weeks per ID.  Continue ambulation, pulm toilet, wean O2.   LOS: 4 days    COLLINS,GINA H 03/05/2013  I have seen and examined Alexander Gray and agree with the above assessment  and plan.  Delight Ovens MD Beeper [phone] Office [phone] 03/05/2013 9:17 AM

## 2013-03-06 ENCOUNTER — Inpatient Hospital Stay (HOSPITAL_COMMUNITY): Payer: MEDICAID

## 2013-03-06 LAB — CBC
HCT: 25.7 % — ABNORMAL LOW (ref 39.0–52.0)
Hemoglobin: 8.5 g/dL — ABNORMAL LOW (ref 13.0–17.0)
MCH: 27.5 pg (ref 26.0–34.0)
MCHC: 33.1 g/dL (ref 30.0–36.0)
MCV: 83.2 fL (ref 78.0–100.0)
Platelets: 725 10*3/uL — ABNORMAL HIGH (ref 150–400)
RBC: 3.09 MIL/uL — ABNORMAL LOW (ref 4.22–5.81)
RDW: 14.9 % (ref 11.5–15.5)
WBC: 11.5 10*3/uL — ABNORMAL HIGH (ref 4.0–10.5)

## 2013-03-06 LAB — BASIC METABOLIC PANEL
BUN: 7 mg/dL (ref 6–23)
CO2: 32 mEq/L (ref 19–32)
Calcium: 8.4 mg/dL (ref 8.4–10.5)
Chloride: 98 mEq/L (ref 96–112)
Creatinine, Ser: 0.61 mg/dL (ref 0.50–1.35)
GFR calc Af Amer: >90 mL/min (ref >90)
GFR calc non Af Amer: >90 mL/min (ref >90)
Glucose, Bld: 120 mg/dL — ABNORMAL HIGH (ref 70–99)
Potassium: 4 mEq/L (ref 3.5–5.1)
Sodium: 135 mEq/L (ref 135–145)

## 2013-03-06 LAB — BODY FLUID CULTURE

## 2013-03-06 NOTE — Discharge Summary (Signed)
[address]       [place] [address]             [phone]              Discharge Summary  Name: Alexander Gray DOB: 17-Jun-1961 52 y.o. MRN: [id number]   Admission Date: 03/01/2013 Discharge Date:     Admitting Diagnosis: Left empyema   Discharge Diagnosis:  Strep viridans left empyema  Past Medical History  Diagnosis Date  . Hemoptysis     2009  . Pleural effusion, left     02/28/13  . S/P thoracentesis 02/23/13    left  . Gastritis 2009    vomiting blood  . Shortness of breath     Hx: of  . Kidney stones     Hx: of  . GERD (gastroesophageal reflux disease)   . Seizures     hx: of       Procedures: VIDEO BRONCHOSCOPY LEFT VIDEO ASSISTED THORACOSCOPY, DRAINAGE OF EMPYEMA, DECORTICATION on 03/01/2013   HPI:  The patient is a 52 y.o. male with previous long smoking history who recently presented at [facility] emergency room with a three-week history of "pinched nerve" involving his left neck and chest and left flank. He was taking up to 10 Aleve a day for the pain. A CT scan of the chest showed a large left complex pleural effusion. He was started on Levaquin, and a thoracentesis was done draining approximately 1500 cc of fluid.  Reportedly, this fluid grew strep viridans. The patient denies any fever or chills. He has noted increasing weakness over the past week and increasing pedal edema. He has had no hemoptysis. He denies any exposure to tuberculosis that he is aware of. He denies working with asbestos. It was felt that he would require surgery to completely drain the empyema, therefore, he was transferred to [facility] under the care of Dr. Tyrone Sage for a thoracic surgery evaluation.     Hospital Course:  The patient was admitted to [facility] on 03/01/2013.  Dr. Tyrone Sage reviewed his films and agreed with the need for surgical intervention.  All risks, benefits and alternatives of surgery were explained in detail, and the  patient agreed to proceed. The patient was taken to the operating room and underwent the above procedure.    The postoperative course has generally been uneventful.  He was seen by Infectious Disease and his antibiotics were changed to Ceftriaxone. His intraoperative cultures have yielded no growth thus far.  A panorex revealed no oral source for infection, and blood cultures have been negative.  It is felt that he will require a 3 week course of antibiotics post-discharge, and a PICC line has been placed for administration.    He has otherwise remained stable. He has remained afebrile, and white blood count is trending down.  His chest tubes have been removed in the standard fashion.  He is ambulating in the hall and tolerating a regular diet.  Home health has been arranged to assist post-discharge.  He is medically stable on today's date for discharge home.    Recent vital signs:  Filed Vitals:   03/06/13 0801  BP:   Pulse:   Temp: 98.6 F (37 C)  Resp:     Recent laboratory studies:  CBC: Recent Labs  03/04/13 1030 03/06/13 0500  WBC 13.4* 11.5*  HGB 8.9* 8.5*  HCT 27.2* 25.7*  PLT 682* 725*   BMET:  Recent Labs  03/04/13 1030 03/06/13 0500  NA 133* 135  K 4.0 4.0  CL 95* 98  CO2 33* 32  GLUCOSE 116* 120*  BUN 6 7  CREATININE 0.61 0.61  CALCIUM 8.3* 8.4    PT/INR: No results found for this basename: LABPROT, INR,  in the last 72 hours   Discharge Medications:   An After Visit Summary was printed and given to the patient.   Medication List    STOP taking these medications       HYDROcodone-acetaminophen 5-325 MG per tablet  Commonly known as:  NORCO/VICODIN      TAKE these medications       cholecalciferol 1000 UNITS tablet  Commonly known as:  VITAMIN D  Take 2,000 Units by mouth daily.     dextrose 5 % SOLN 50 mL with cefTRIAXone 2 G SOLR 2 g  Inject 2 g into the vein daily.     diphenhydramine-acetaminophen 25-500 MG Tabs  Commonly known as:   TYLENOL PM  Take 1 tablet by mouth at bedtime as needed (for sleep).     Fish Oil 1200 MG Caps  Take 1,200 mg by mouth daily.     guaiFENesin 600 MG 12 hr tablet  Commonly known as:  MUCINEX  Take 1 tablet (600 mg total) by mouth 2 (two) times daily.     multivitamin with minerals Tabs  Take 1 tablet by mouth daily.     oxyCODONE-acetaminophen 5-325 MG per tablet  Commonly known as:  PERCOCET/ROXICET  Take 1-2 tablets by mouth every 4 (four) hours as needed.     tetrahydrozoline 0.05 % ophthalmic solution  Place 2 drops into both eyes 2 (two) times daily as needed (for dry eyes).     vitamin B-12 1000 MCG tablet  Commonly known as:  CYANOCOBALAMIN  Take 1,000 mcg by mouth daily.     vitamin C 500 MG tablet  Commonly known as:  ASCORBIC ACID  Take 500 mg by mouth daily.        Discharge Instructions:  The patient is to refrain from driving, heavy lifting or strenuous activity.  May shower daily and clean incisions with soap and water.  May resume regular diet.   Follow Up:    Follow-up Information   Follow up with GERHARDT,EDWARD B, MD. (Office will contact you with an appointment)    Contact information:   [address] [place] [address] [phone]       Follow up with Judyann Munson, MD. Schedule an appointment as soon as possible for a visit in 2 weeks.   Contact information:   [address] [place] [address] [phone]             COLLINS,GINA H 03/06/2013, 10:58 AM

## 2013-03-06 NOTE — Progress Notes (Addendum)
       [address]       [name] [address]             [phone]          5 Days Post-Op Procedure(s) (LRB): VIDEO BRONCHOSCOPY (N/A) VIDEO ASSISTED THORACOSCOPY (VATS)/EMPYEMA (Left)  Subjective: Waiting to be taken to xray.  No complaints.   Objective: Vital signs in last 24 hours: Patient Vitals for the past 24 hrs:  BP Temp Temp src Pulse Resp SpO2  03/06/13 0743 - - - - 17 -  03/06/13 0424 124/49 mmHg 99.5 F (37.5 C) Oral 83 19 91 %  03/05/13 2359 107/60 mmHg 98.2 F (36.8 C) Oral 91 17 95 %  03/05/13 2055 - - - - - 94 %  03/05/13 1945 110/53 mmHg 99.4 F (37.4 C) Oral 83 15 96 %  03/05/13 1609 115/54 mmHg 100.6 F (38.1 C) Oral 85 21 97 %  03/05/13 1538 - - - - 17 -  03/05/13 1457 - - - - - 100 %  03/05/13 1205 - - - - 15 -  03/05/13 1200 116/54 mmHg 98.4 F (36.9 C) Oral - - -  03/05/13 1143 - 98.4 F (36.9 C) Oral - - -  03/05/13 0930 - - - - - 97 %   Current Weight  03/02/13 194 lb 0.1 oz (88 kg)     Intake/Output from previous day: 06/28 0701 - 06/29 0700 In: 2050 [P.O.:1560; I.V.:490] Out: 3010 [Urine:2900; Chest Tube:110]    PHYSICAL EXAM:  Heart: RRR Lungs: Clear, slightly decreased BS on L Wound: Clean and dry Chest tube: no air leak    Lab Results: CBC: Recent Labs  03/04/13 1030 03/06/13 0500  WBC 13.4* 11.5*  HGB 8.9* 8.5*  HCT 27.2* 25.7*  PLT 682* 725*   BMET:  Recent Labs  03/04/13 1030 03/06/13 0500  NA 133* 135  K 4.0 4.0  CL 95* 98  CO2 33* 32  GLUCOSE 116* 120*  BUN 6 7  CREATININE 0.61 0.61  CALCIUM 8.3* 8.4    PT/INR: No results found for this basename: LABPROT, INR,  in the last 72 hours    Assessment/Plan: S/P Procedure(s) (LRB): VIDEO BRONCHOSCOPY (N/A) VIDEO ASSISTED THORACOSCOPY (VATS)/EMPYEMA (Left) Am CXR not done yet- will follow up when available. Strep viridans empyema- D#5 Rocephin. Continue x 2-3 weeks per ID.  WBC trending down. Continue CT to water seal for  now.    LOS: 5 days    COLLINS,GINA H 03/06/2013  Chest xray reviewed Will d/c remaining chest tube pic line for home antibiotics I have seen and examined Laurence Spates and agree with the above assessment  and plan.  Delight Ovens MD Beeper [phone] Office [phone] 03/06/2013 9:30 AM

## 2013-03-07 ENCOUNTER — Inpatient Hospital Stay (HOSPITAL_COMMUNITY): Payer: MEDICAID

## 2013-03-07 LAB — BASIC METABOLIC PANEL
BUN: 9 mg/dL (ref 6–23)
CO2: 30 mEq/L (ref 19–32)
Calcium: 8.2 mg/dL — ABNORMAL LOW (ref 8.4–10.5)
Chloride: 97 mEq/L (ref 96–112)
Creatinine, Ser: 0.6 mg/dL (ref 0.50–1.35)
GFR calc Af Amer: >90 mL/min (ref >90)
GFR calc non Af Amer: >90 mL/min (ref >90)
Glucose, Bld: 112 mg/dL — ABNORMAL HIGH (ref 70–99)
Potassium: 4.2 mEq/L (ref 3.5–5.1)
Sodium: 133 mEq/L — ABNORMAL LOW (ref 135–145)

## 2013-03-07 LAB — CBC
HCT: 25.6 % — ABNORMAL LOW (ref 39.0–52.0)
Hemoglobin: 8.4 g/dL — ABNORMAL LOW (ref 13.0–17.0)
MCH: 27.5 pg (ref 26.0–34.0)
MCHC: 32.8 g/dL (ref 30.0–36.0)
MCV: 83.7 fL (ref 78.0–100.0)
Platelets: 818 10*3/uL — ABNORMAL HIGH (ref 150–400)
RBC: 3.06 MIL/uL — ABNORMAL LOW (ref 4.22–5.81)
RDW: 14.7 % (ref 11.5–15.5)
WBC: 13.4 10*3/uL — ABNORMAL HIGH (ref 4.0–10.5)

## 2013-03-07 MED ORDER — OXYCODONE-ACETAMINOPHEN 5-325 MG PO TABS: 1.0000 | ORAL_TABLET | ORAL | Status: DC | PRN

## 2013-03-07 MED ORDER — GUAIFENESIN ER 600 MG PO TB12: 600.0000 mg | ORAL_TABLET | Freq: Two times a day (BID) | ORAL | Status: DC

## 2013-03-07 MED ORDER — DEXTROSE 5 % IV SOLN: 2.0000 g | INTRAVENOUS | Status: DC

## 2013-03-07 MED ORDER — SODIUM CHLORIDE 0.9 % IJ SOLN: 10.0000 mL | INTRAMUSCULAR | Status: DC | PRN

## 2013-03-07 MED ORDER — SODIUM CHLORIDE 0.9 % IJ SOLN
10.0000 mL | Freq: Two times a day (BID) | INTRAMUSCULAR | Status: DC
  Administered 2013-03-07: 10 mL

## 2013-03-07 NOTE — Progress Notes (Signed)
Peripherally Inserted Central Catheter/Midline Placement  The IV Nurse has discussed with the patient and/or persons authorized to consent for the patient, the purpose of this procedure and the potential benefits and risks involved with this procedure.  The benefits include less needle sticks, lab draws from the catheter and patient may be discharged home with the catheter.  Risks include, but not limited to, infection, bleeding, blood clot (thrombus formation), and puncture of an artery; nerve damage and irregular heat beat.  Alternatives to this procedure were also discussed.  PICC/Midline Placement Documentation        Lisabeth Devoid 03/07/2013, 10:52 AM Consent obtained by Merleen Milliner, RN, CRNI

## 2013-03-07 NOTE — Progress Notes (Signed)
   CARE MANAGEMENT NOTE 03/07/2013  Patient:  Alexander Gray, Alexander Gray   Account Number:  [id number]  Date Initiated:  03/02/2013  Documentation initiated by:  [facility]  Subjective/Objective Assessment:   POST op drainage of empyema.     Action/Plan:   pt is from [place], will live with parents for 3-4 weeks until he has completed medication   Anticipated DC Date:  03/07/2013   Anticipated DC Plan:  HOME W HOME HEALTH SERVICES      DC Planning Services  CM consult      Choice offered to / List presented to:          [facility] arranged  HH-1 RN  IV Antibiotics      HH agency  [facility]   Status of service:  Completed, signed off Medicare Important Message given?   (If response is "NO", the following Medicare IM given date fields will be blank) Date Medicare IM given:   Date Additional Medicare IM given:    Discharge Disposition:  HOME W HOME HEALTH SERVICES  Per UR Regulation:  Reviewed for med. necessity/level of care/duration of stay  If discussed at Long Length of Stay Meetings, dates discussed:    Comments:  03/07/2013 1230 NCM spoke to pt and states he will stay with his parents until he is well enough to go back to [place]. 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 NCM contacted [facility] RN for IV abx with scheduled dc today. Unit RN will give dose today at 2 pm and next dose scheduled for 7/1. Isidoro Donning RN CCM Case Mgmt phone [phone]  Contact:  Demetrius,[place] Father [phone]  03-02-13 11am Avie Arenas, RNBSN [phone] Patient from [facility] - visiting father - who is in room. Discharge plan is to go home with parents when able.

## 2013-03-07 NOTE — Progress Notes (Signed)
[facility]    Date of Admission:  03/01/2013   Total days of antibiotics 7        Day 6 cetriaxone        ( 1 day of piptazo and vanco)           ID: Alexander Gray is a 52 y.o. 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 Last fever of 100.6 on 6/28, but has been doing well otherwise. Chest tube removed.  Medications:  . bisacodyl  10 mg Oral Daily  . cefTRIAXone (ROCEPHIN)  IV  2 g Intravenous Q24H  . cholecalciferol  2,000 Units Oral Daily  . enoxaparin  30 mg Subcutaneous Q24H  . guaiFENesin  600 mg Oral BID  . levalbuterol  0.63 mg Nebulization TID  . multivitamin with minerals  1 tablet Oral Daily  . omeprazole  20 mg Oral Q0600  . sodium chloride  10 mL Intravenous Q12H  . vitamin B-12  1,000 mcg Oral Daily  . vitamin C  500 mg Oral Daily    Objective: Vital signs in last 24 hours: Temp:  [97.6 F (36.4 C)-99.2 F (37.3 C)] 98.1 F (36.7 C) (06/30 0800) Pulse Rate:  [91-96] 94 (06/30 0800) Resp:  [18-21] 20 (06/30 0800) BP: (98-121)/(53-61) 105/58 mmHg (06/30 0800) SpO2:  [91 %-97 %] 95 % (06/30 0800)  Constitutional: He is oriented to person, place, and time. He appears well-developed and well-nourished. No distress.  HENT:  Mouth/Throat: Oropharynx is clear and moist. No oropharyngeal exudate. Cardiovascular: Normal rate, regular rhythm and normal heart sounds. Exam reveals no gallop and no friction rub.  No murmur heard.  Pulmonary/Chest: Effort normal and breath sounds normal. No respiratory distress. He has no wheezes. Decrease breath sounds on left hemithorax Lymphadenopathy: no cervical adenopathy.  Skin: Skin is warm and dry. No rash noted. No erythema.  Ext: trace edema on lower extremities bilaterally  Lab Results  Recent Labs  03/06/13 0500 03/07/13 0400  WBC 11.5* 13.4*  HGB 8.5* 8.4*  HCT 25.7* 25.6*  NA 135 133*  K 4.0 4.2    CL 98 97  CO2 32 30  BUN 7 9  CREATININE 0.61 0.60   Liver Panel No results found for this basename: PROT, ALBUMIN, AST, ALT, ALKPHOS, BILITOT, BILIDIR, IBILI,  in the last 72 hours  Microbiology: 6/25 blood cx NGTD 6/24 bronchial wash NOPF 6/24 pleural fluid strep intermedius (pcn sensitive) 6/18 viridans strep Studies/Results: Dg Chest 2 View  03/07/2013   *RADIOLOGY REPORT*  Clinical Data: Cough of left empyema  CHEST - 2 VIEW  Comparison: Chest x-ray of 03/06/2013  Findings: The left chest tube has been removed.  There is little change in the left pleural and parenchymal opacities in this patient with history of empyema.  The right lung is clear.  Mild cardiomegaly is stable.  Old healed fifth and sixth right rib fractures are noted.  IMPRESSION:  1.  Left chest tube removed.  No pneumothorax. 2.  No change in parenchymal and pleural opacities primarily the left lung base in this patient with history of empyema.   Original Report Authenticated By: Dwyane Dee, M.D.   Dg Chest 2 View  03/06/2013   *RADIOLOGY REPORT*  Clinical Data: Left chest tube.  CHEST - 2 VIEW  Comparison: 02/25/2013.  Findings: The position of the left thoracic tube is place unchanged.  The left subclavian line position is unchanged.  There is no pneumothorax.  Thickening along the left lateral pleural stripe is unchanged.  Focal airspace opacity at the left base is stable.  There is less interstitial congestion in the left lung on today's exam.  The right lung remains clear.  There have been no other interval changes.  Cardiac and mediastinal silhouettes appear normal.  There are no acute bony changes.  IMPRESSION: Slight clearing of the interstitium in the left lung compared to the prior study.  No additional interval changes.   Original Report Authenticated By: Sander Radon, M.D.     Assessment/Plan: 51yo strep intermedius/viridans empyema s/p VAT decortication on ceftriaxone  - continue with ceftriaxone 2gm  IV daily would treat with IV antibiotics for 3 wk, then convert to orals. - recommend to get midline IV for remaining part of IV antibiotics - repeat cbc in the am, to ensure that leukocytosis is improving, if he has repeat temp >100.3 recommend to repeat blood cx, ua, and urine cx   we will set up appt in ID clinic for follow up in 2 wks  Yalitza Teed, [facility] Cell: [phone] Pager: [phone]  03/07/2013, 9:21 AM

## 2013-03-07 NOTE — Progress Notes (Signed)
[facility] DAILY ICU PROGRESS NOTE                   [address]            [place] [address]          [phone]   6 Days Post-Op Procedure(s) (LRB): VIDEO BRONCHOSCOPY (N/A) VIDEO ASSISTED THORACOSCOPY (VATS)/EMPYEMA (Left)  Total Length of Stay:  LOS: 6 days   Subjective: Feels pretty well, PICC just placed   Objective: Vital signs in last 24 hours: Temp:  [97.6 F (36.4 C)-99.2 F (37.3 C)] 98.1 F (36.7 C) (06/30 0800) Pulse Rate:  [91-96] 94 (06/30 0800) Cardiac Rhythm:  [-] Normal sinus rhythm (06/30 0800) Resp:  [18-21] 20 (06/30 0800) BP: (98-121)/(53-61) 105/58 mmHg (06/30 0800) SpO2:  [91 %-97 %] 91 % (06/30 0959)  Filed Weights   03/01/13 0950 03/01/13 1252 03/02/13 0600  Weight: 199 lb 4 oz (90.379 kg) 199 lb 8.3 oz (90.5 kg) 194 lb 0.1 oz (88 kg)    Weight change:    Hemodynamic parameters for last 24 hours:    Intake/Output from previous day: 06/29 0701 - 06/30 0700 In: 830 [P.O.:720; I.V.:110] Out: 1692 [Urine:1650; Stool:2; Chest Tube:40]  Intake/Output this shift:    Current Meds: Scheduled Meds: . bisacodyl  10 mg Oral Daily  . cefTRIAXone (ROCEPHIN)  IV  2 g Intravenous Q24H  . cholecalciferol  2,000 Units Oral Daily  . enoxaparin  30 mg Subcutaneous Q24H  . guaiFENesin  600 mg Oral BID  . levalbuterol  0.63 mg Nebulization TID  . multivitamin with minerals  1 tablet Oral Daily  . omeprazole  20 mg Oral Q0600  . sodium chloride  10 mL Intravenous Q12H  . sodium chloride  10-40 mL Intracatheter Q12H  . vitamin B-12  1,000 mcg Oral Daily  . vitamin C  500 mg Oral Daily   Continuous Infusions: . dextrose 5 % and 0.45% NaCl 20 mL/hr at 03/05/13 1958   PRN Meds:.alum & mag hydroxide-simeth, calcium carbonate, lactulose, naphazoline, ondansetron (ZOFRAN) IV, oxyCODONE-acetaminophen, potassium chloride, senna-docusate, sodium chloride, sodium chloride  General appearance: alert, cooperative and no distress Heart: regular  rate and rhythm Lungs: mildy dim in left base Abdomen: benign Extremities: extremities normal, atraumatic, no cyanosis or edema Wound: incisions healing well  Lab Results: CBC: Recent Labs  03/06/13 0500 03/07/13 0400  WBC 11.5* 13.4*  HGB 8.5* 8.4*  HCT 25.7* 25.6*  PLT 725* 818*   BMET:  Recent Labs  03/06/13 0500 03/07/13 0400  NA 135 133*  K 4.0 4.2  CL 98 97  CO2 32 30  GLUCOSE 120* 112*  BUN 7 9  CREATININE 0.61 0.60  CALCIUM 8.4 8.2*    PT/INR: No results found for this basename: LABPROT, INR,  in the last 72 hours Radiology: Dg Chest 2 View  03/07/2013   *RADIOLOGY REPORT*  Clinical Data: Cough of left empyema  CHEST - 2 VIEW  Comparison: Chest x-ray of 03/06/2013  Findings: The left chest tube has been removed.  There is little change in the left pleural and parenchymal opacities in this patient with history of empyema.  The right lung is clear.  Mild cardiomegaly is stable.  Old healed fifth and sixth right rib fractures are noted.  IMPRESSION:  1.  Left chest tube removed.  No pneumothorax. 2.  No change in parenchymal and pleural opacities primarily the left lung base in this patient with history of empyema.  Original Report Authenticated By: Dwyane Dee, M.D.   Dg Chest 2 View  03/06/2013   *RADIOLOGY REPORT*  Clinical Data: Left chest tube.  CHEST - 2 VIEW  Comparison: 02/25/2013.  Findings: The position of the left thoracic tube is place unchanged.  The left subclavian line position is unchanged.  There is no pneumothorax.  Thickening along the left lateral pleural stripe is unchanged.  Focal airspace opacity at the left base is stable.  There is less interstitial congestion in the left lung on today's exam.  The right lung remains clear.  There have been no other interval changes.  Cardiac and mediastinal silhouettes appear normal.  There are no acute bony changes.  IMPRESSION: Slight clearing of the interstitium in the left lung compared to the prior study.  No  additional interval changes.   Original Report Authenticated By: Sander Radon, M.D.     Assessment/Plan: S/P Procedure(s) (LRB): VIDEO BRONCHOSCOPY (N/A) VIDEO ASSISTED THORACOSCOPY (VATS)/EMPYEMA (Left) Plan for discharge: see discharge orders     GOLD,WAYNE E 03/07/2013 11:21 AM

## 2013-03-07 NOTE — Progress Notes (Signed)
Discharge instructions given to pt and parents.  All verbalized understanding with all questions answered.  Pt discharged to home with parents  Roselie Awkward, RN

## 2013-03-08 LAB — CULTURE, BLOOD (ROUTINE X 2): Culture: NO GROWTH

## 2013-03-09 ENCOUNTER — Telehealth: Payer: Self-pay | Admitting: *Deleted

## 2013-03-09 ENCOUNTER — Other Ambulatory Visit: Payer: Self-pay | Admitting: *Deleted

## 2013-03-09 NOTE — Telephone Encounter (Signed)
Pt has refused [facility] Social Work services

## 2013-03-10 ENCOUNTER — Telehealth: Payer: Self-pay | Admitting: *Deleted

## 2013-03-10 NOTE — Telephone Encounter (Signed)
Patient called to say that he saw Dr Drue Second in the hospital and wanted to schedule a follow up appt. Advised the patient he already has one scheduled for 03/24/13 at 9 am. He also asked how he gets a refill on her Percocet. Advised him he was given a Rx for 50 pills on Monday and we will not be giving him anymore at least until he sees Dr Drue Second or he can call his PCP. He asked how I know that and advised him its in the system. He advised fine and hung up the phone.

## 2013-03-15 ENCOUNTER — Ambulatory Visit (INDEPENDENT_AMBULATORY_CARE_PROVIDER_SITE_OTHER): Payer: Self-pay

## 2013-03-15 DIAGNOSIS — D381 Neoplasm of uncertain behavior of trachea, bronchus and lung: Secondary | ICD-10-CM

## 2013-03-15 DIAGNOSIS — Z4802 Encounter for removal of sutures: Secondary | ICD-10-CM

## 2013-03-15 MED ORDER — HYDROCODONE-ACETAMINOPHEN 7.5-325 MG PO TABS: 1.0000 | ORAL_TABLET | ORAL | Status: DC | PRN

## 2013-03-15 NOTE — Progress Notes (Signed)
Removed 3 sutures from chest tube sites, No signs of infection and pt tolerate well. Rx for pain Norco 7.5/325 mg  called to pharm.

## 2013-03-18 ENCOUNTER — Emergency Department (HOSPITAL_COMMUNITY): Payer: Self-pay

## 2013-03-18 ENCOUNTER — Emergency Department (HOSPITAL_COMMUNITY)
Admission: EM | Admit: 2013-03-18 | Discharge: 2013-03-19 | Disposition: A | Discharge status: Home / Self Care | Payer: Self-pay | Attending: Emergency Medicine | Admitting: Emergency Medicine

## 2013-03-18 ENCOUNTER — Encounter (HOSPITAL_COMMUNITY): Payer: Self-pay | Admitting: *Deleted

## 2013-03-18 DIAGNOSIS — Y838 Other surgical procedures as the cause of abnormal reaction of the patient, or of later complication, without mention of misadventure at the time of the procedure: Secondary | ICD-10-CM | POA: Insufficient documentation

## 2013-03-18 DIAGNOSIS — T80218A Other infection due to central venous catheter, initial encounter: Secondary | ICD-10-CM | POA: Insufficient documentation

## 2013-03-18 DIAGNOSIS — Z8709 Personal history of other diseases of the respiratory system: Secondary | ICD-10-CM | POA: Insufficient documentation

## 2013-03-18 DIAGNOSIS — Z87442 Personal history of urinary calculi: Secondary | ICD-10-CM | POA: Insufficient documentation

## 2013-03-18 DIAGNOSIS — Z8619 Personal history of other infectious and parasitic diseases: Secondary | ICD-10-CM | POA: Insufficient documentation

## 2013-03-18 DIAGNOSIS — Z95828 Presence of other vascular implants and grafts: Secondary | ICD-10-CM

## 2013-03-18 DIAGNOSIS — Z79899 Other long term (current) drug therapy: Secondary | ICD-10-CM | POA: Insufficient documentation

## 2013-03-18 DIAGNOSIS — Z8719 Personal history of other diseases of the digestive system: Secondary | ICD-10-CM | POA: Insufficient documentation

## 2013-03-18 DIAGNOSIS — Z9889 Other specified postprocedural states: Secondary | ICD-10-CM | POA: Insufficient documentation

## 2013-03-18 HISTORY — DX: Alcohol abuse, in remission: F10.11

## 2013-03-18 HISTORY — DX: Partial loss of teeth, unspecified cause, unspecified class: K08.409

## 2013-03-18 HISTORY — DX: Streptococcal infection, unspecified site: A49.1

## 2013-03-18 MED ORDER — DOCUSATE SODIUM 100 MG PO CAPS
100.0000 mg | ORAL_CAPSULE | ORAL | Status: AC
  Administered 2013-03-18: 100 mg via ORAL
  Filled 2013-03-18 (×2): qty 1

## 2013-03-18 MED ORDER — VITAMIN D3 25 MCG (1000 UNIT) PO TABS
2000.0000 [IU] | ORAL_TABLET | Freq: Every day | ORAL | Status: DC
  Administered 2013-03-19: 2000 [IU] via ORAL
  Filled 2013-03-18: qty 2

## 2013-03-18 MED ORDER — VITAMIN B-12 1000 MCG PO TABS
1000.0000 ug | ORAL_TABLET | Freq: Every day | ORAL | Status: DC
  Administered 2013-03-19: 1000 ug via ORAL
  Filled 2013-03-18: qty 1

## 2013-03-18 MED ORDER — DEXTROSE 5 % IV SOLN: 2.0000 g | INTRAVENOUS | Status: DC

## 2013-03-18 MED ORDER — NAPHAZOLINE HCL 0.1 % OP SOLN
1.0000 [drp] | Freq: Four times a day (QID) | OPHTHALMIC | Status: DC | PRN
  Filled 2013-03-18: qty 15

## 2013-03-18 MED ORDER — OMEGA-3-ACID ETHYL ESTERS 1 G PO CAPS
1.0000 g | ORAL_CAPSULE | Freq: Two times a day (BID) | ORAL | Status: DC
  Administered 2013-03-19: 1 g via ORAL
  Filled 2013-03-18 (×3): qty 1

## 2013-03-18 MED ORDER — HYDROCODONE-ACETAMINOPHEN 7.5-325 MG PO TABS
1.0000 | ORAL_TABLET | ORAL | Status: DC | PRN
  Administered 2013-03-18 – 2013-03-19 (×4): 1 via ORAL
  Filled 2013-03-18 (×4): qty 1

## 2013-03-18 MED ORDER — VITAMIN C 500 MG PO TABS
500.0000 mg | ORAL_TABLET | Freq: Every day | ORAL | Status: DC
Start: 2013-03-19 — End: 2013-03-19
  Administered 2013-03-19: 500 mg via ORAL
  Filled 2013-03-18: qty 1

## 2013-03-18 MED ORDER — ADULT MULTIVITAMIN W/MINERALS CH
1.0000 | ORAL_TABLET | Freq: Every day | ORAL | Status: DC
  Administered 2013-03-19: 1 via ORAL
  Filled 2013-03-18 (×2): qty 1

## 2013-03-18 MED ORDER — LORAZEPAM 1 MG PO TABS
1.0000 mg | ORAL_TABLET | Freq: Once | ORAL | Status: AC
  Administered 2013-03-18: 1 mg via ORAL
  Filled 2013-03-18: qty 1

## 2013-03-18 MED ORDER — GUAIFENESIN 200 MG PO TABS
400.0000 mg | ORAL_TABLET | ORAL | Status: AC
  Administered 2013-03-18: 400 mg via ORAL
  Filled 2013-03-18 (×2): qty 2

## 2013-03-18 NOTE — ED Notes (Signed)
Pt in stating he had a picc line that he was receiving medications through at home and it came out today, states he was told to come to the emergency room to get it replaced.

## 2013-03-18 NOTE — ED Notes (Addendum)
Pt came to the ED because his PICC line fell out. He needs a PICC line because he received ABX through line daily. He is alert and oriented. Neurovascular WNL. No swelling or bleeding noted. Insertion site has scab. Pt stated that he had his dose of ABX today at 1400.  Will continue to monitor.

## 2013-03-18 NOTE — ED Provider Notes (Signed)
History  This chart was scribed for Glynn Octave, MD by Ardelia Mems, ED Scribe.   CSN: [id number]  Arrival date & time 03/18/13  1746   Chief Complaint  Patient presents with  . Vascular Access Problem    The history is provided by the patient. No language interpreter was used.   HPI Comments: Alexander Gray is a 52 y.o. male who presents to the Emergency Department wanting to get a picc line in his right antecubital placed. Pt states that he has the picc line so that he can medicate himself at home with IV antibiotics for a strep infection of his lungs. Pt states that he noticed the picc line came out today when the nurse came to change the dressing, and he states he was told to come to the emergency room to get it replaced. Pt states that he had the picc line put in about 1 week and a half ago. Pt reports associated mild "2-3/10" pain described as discomfort at the site of the picc line. Pt states that he is still taking daily antibiotics. Still has some occasional chest pain, no new symptoms, denies shortness of breath. Pt denis fever, chills or any other symptoms.  Past Medical History  Diagnosis Date  . Hemoptysis     2009  . Pleural effusion, left     02/28/13  . S/P thoracentesis 02/23/13    left  . Gastritis 2009    vomiting blood  . Shortness of breath     Hx: of  . Kidney stones     Hx: of  . GERD (gastroesophageal reflux disease)   . Seizures     hx: of    Past Surgical History  Procedure Laterality Date  . Tonsillectomy    . Video bronchoscopy N/A 03/01/2013    Procedure: VIDEO BRONCHOSCOPY;  Surgeon: Delight Ovens, MD;  Location: [facility] OR;  Service: Thoracic;  Laterality: N/A;  . Video assisted thoracoscopy (vats)/empyema Left 03/01/2013    Procedure: VIDEO ASSISTED THORACOSCOPY (VATS)/EMPYEMA;  Surgeon: Delight Ovens, MD;  Location: [facility] OR;  Service: Thoracic;  Laterality: Left;  DRAINAGE OF EMPYEMA   History reviewed. No pertinent family  history. History  Substance Use Topics  . Smoking status: Former Smoker -- 1.00 packs/day for 35 years    Types: Cigarettes    Quit date: 09/30/2012  . Smokeless tobacco: Never Used     Comment: vapor for 6 months  . Alcohol Use: No    Review of Systems  Constitutional: Negative for fever and chills.  HENT: Negative for congestion, sore throat, rhinorrhea and neck pain.   Eyes: Negative for visual disturbance.  Respiratory: Negative for cough and shortness of breath.   Cardiovascular: Negative for chest pain.  Gastrointestinal: Negative for nausea, vomiting, abdominal pain and diarrhea.  Genitourinary: Negative for dysuria.  Musculoskeletal: Negative for back pain.  Skin: Negative for rash.  Neurological: Negative for headaches.  Psychiatric/Behavioral: Negative for confusion.   A complete 10 system review of systems was obtained and all systems are negative except as noted in the HPI and PMH.   Allergies  Review of patient's allergies indicates no known allergies.  Home Medications   Current Outpatient Rx  Name  Route  Sig  Dispense  Refill  . cholecalciferol (VITAMIN D) 1000 UNITS tablet   Oral   Take 2,000 Units by mouth daily.         Marland Kitchen dextrose 5 % SOLN 50 mL with cefTRIAXone 2 G SOLR 2  g   Intravenous   Inject 2 g into the vein daily.   1 Container   14   . diphenhydramine-acetaminophen (TYLENOL PM) 25-500 MG TABS   Oral   Take 1 tablet by mouth at bedtime as needed (for sleep).          Marland Kitchen guaiFENesin (MUCINEX) 600 MG 12 hr tablet   Oral   Take 1 tablet (600 mg total) by mouth 2 (two) times daily.   30 tablet   0   . HYDROcodone-acetaminophen (NORCO) 7.5-325 MG per tablet   Oral   Take 1 tablet by mouth every 4 (four) hours as needed for pain.   40 tablet   0   . Multiple Vitamin (MULTIVITAMIN WITH MINERALS) TABS   Oral   Take 1 tablet by mouth daily.         . Omega-3 Fatty Acids (FISH OIL) 1200 MG CAPS   Oral   Take 1,200 mg by mouth  daily.         Marland Kitchen tetrahydrozoline 0.05 % ophthalmic solution   Both Eyes   Place 2 drops into both eyes 2 (two) times daily as needed (for dry eyes).         . vitamin B-12 (CYANOCOBALAMIN) 1000 MCG tablet   Oral   Take 1,000 mcg by mouth daily.         . vitamin C (ASCORBIC ACID) 500 MG tablet   Oral   Take 500 mg by mouth daily.          Triage Vitals: BP 124/62  Pulse 88  Temp(Src) 97.9 F (36.6 C) (Oral)  Resp 16  SpO2 100%  Physical Exam  Nursing note and vitals reviewed. Constitutional: He is oriented to person, place, and time. He appears well-developed and well-nourished. No distress.  HENT:  Head: Normocephalic and atraumatic.  Eyes: Conjunctivae and EOM are normal.  Neck: Normal range of motion. Neck supple.  Cardiovascular: Normal rate, regular rhythm and normal heart sounds.   Pulmonary/Chest: Effort normal and breath sounds normal.  Musculoskeletal: Normal range of motion. He exhibits no edema.  Neurological: He is alert and oriented to person, place, and time.  Skin: Skin is warm and dry.     Psychiatric: He has a normal mood and affect. His behavior is normal.    ED Course  Procedures (including critical care time)  DIAGNOSTIC STUDIES: Oxygen Saturation is 100% on RA, normal by my interpretation.    COORDINATION OF CARE: 6:13 PM- Pt advised of plan for treatment and pt agrees.     Labs Reviewed - No data to display  Dg Chest 2 View  03/18/2013   *RADIOLOGY REPORT*  Clinical Data: Pneumonia.  Left pleural effusion.  CHEST - 2 VIEW  Comparison: 03/07/2013  Findings: Left subclavian center venous catheter has been removed. Increased size of left hydropneumothorax is seen since previous study.  There is increased atelectasis or infiltrate in the left lower lobe.  Right lung is clear.  Heart size is stable.  Old right posterior rib fracture deformities again noted.  IMPRESSION: Increased left hydropneumothorax, with increased left lower lobe  atelectasis versus infiltrate.   Original Report Authenticated By: Myles Rosenthal, M.D.    No diagnosis found.  MDM  Patient here for new PICC line placement. He is in NAD. Physical exam unremarkable. Vitals stable. He'll be moved over to pod C overnight until PICC line can be placed at 8:00 in the morning. IV team contacted, PICC line cannot  be placed tonight. Patient is from a [place] and was brought her for placement of PICC line, would rather stay in the emergency department and half to return. Patient discussed with Dr. Manus Gunning who agrees with plan of care. Will get CXR.  10:39 PM CXR showing increased L hydropneumothorax. Will consult cardiothoracic surgery.  10:54 PM Spoke with Dr. Cornelius Moras who does not feel the CXR is changed in any way and is not concerned of any emergent need for further evaluation. He will f/u with Dr. Lowella Fairy outpatient in office as scheduled on July 31.     I personally performed the services described in this documentation, which was scribed in my presence. The recorded information has been reviewed and is accurate.    Trevor Mace, PA-C 03/18/13 1838  Trevor Mace, PA-C 03/19/13 1357

## 2013-03-18 NOTE — ED Notes (Signed)
Spoke with [place], [place] and she stated that pt does not need ABX until tomorrow at 1300 because he already received dose today. She also stated that pt will have PICC line placed at 0800 on 03/19/2013. Pt notified of plans. Will continue to monitor.

## 2013-03-18 NOTE — ED Notes (Signed)
Informed by IV team that they would not be able to get to PICC line today.  Pt notified and stated he would stay the night to get his  PICC replaced in the morning.

## 2013-03-18 NOTE — ED Notes (Signed)
Followed up with Radiology and they stated they will come get pt for Chest Xray soon. Notified pt of timeframe.

## 2013-03-18 NOTE — ED Notes (Signed)
Report given to Kim RN.

## 2013-03-18 NOTE — ED Notes (Signed)
Food provided to pt.

## 2013-03-18 NOTE — ED Notes (Signed)
Food has been ordered for pt.

## 2013-03-19 ENCOUNTER — Encounter (HOSPITAL_COMMUNITY): Payer: Self-pay | Admitting: *Deleted

## 2013-03-19 MED ORDER — HEPARIN SOD (PORK) LOCK FLUSH 100 UNIT/ML IV SOLN
250.0000 [IU] | INTRAVENOUS | Status: DC | PRN
  Filled 2013-03-19: qty 3

## 2013-03-19 MED ORDER — SODIUM CHLORIDE 0.9 % IJ SOLN: 10.0000 mL | INTRAMUSCULAR | Status: DC | PRN

## 2013-03-19 NOTE — ED Provider Notes (Signed)
A midline was placed by the IV team.  He will continue his home abx.  Rolan Bucco, MD 03/19/13 [phone]

## 2013-03-19 NOTE — Progress Notes (Signed)
Peripherally Inserted Central Catheter/Midline Placement  The IV Nurse has discussed with the patient and/or persons authorized to consent for the patient, the purpose of this procedure and the potential benefits and risks involved with this procedure.  The benefits include less needle sticks, lab draws from the catheter and patient may be discharged home with the catheter.  Risks include, but not limited to, infection, bleeding, blood clot (thrombus formation), and puncture of an artery; nerve damage and irregular heat beat.  Alternatives to this procedure were also discussed.  PICC/Midline Placement Documentation  PICC / Midline Single Lumen 03/07/13 Midline Right Basilic (Active)     PICC / Midline Single Lumen 03/19/13 Midline Right Basilic (Active)  Indication for Insertion or Continuance of Line Prolonged intravenous therapies 03/19/2013  9:42 AM  Length mark (cm) 0 cm 03/19/2013  9:42 AM  Site Assessment Clean;Dry;Intact 03/19/2013  9:42 AM  Line Status Flushed;Saline locked 03/19/2013  9:42 AM  Dressing Type Transparent 03/19/2013  9:42 AM  Dressing Status Clean;Dry;Intact;Antimicrobial disc in place 03/19/2013  9:42 AM  Line Care Connections checked and tightened 03/19/2013  9:42 AM  Dressing Intervention New dressing 03/19/2013  9:42 AM  Dressing Change Due 03/26/13 03/19/2013  9:42 AM       Geoffery Spruce 03/19/2013, 9:43 AM

## 2013-03-19 NOTE — ED Provider Notes (Signed)
Medical screening examination/treatment/procedure(s) were performed by non-physician practitioner and as supervising physician I was immediately available for consultation/collaboration.   Glynn Octave, MD 03/19/13 [phone]

## 2013-03-22 ENCOUNTER — Inpatient Hospital Stay: Payer: Self-pay | Admitting: Internal Medicine

## 2013-03-24 ENCOUNTER — Ambulatory Visit (HOSPITAL_COMMUNITY)
Admission: RE | Admit: 2013-03-24 | Discharge: 2013-03-24 | Disposition: A | Discharge status: Home / Self Care | Payer: Self-pay | Source: Ambulatory Visit | Attending: Internal Medicine | Admitting: Internal Medicine

## 2013-03-24 ENCOUNTER — Ambulatory Visit (INDEPENDENT_AMBULATORY_CARE_PROVIDER_SITE_OTHER): Payer: Self-pay | Admitting: Internal Medicine

## 2013-03-24 VITALS — Temp 98.4°F | Wt 182.0 lb

## 2013-03-24 DIAGNOSIS — J90 Pleural effusion, not elsewhere classified: Secondary | ICD-10-CM | POA: Insufficient documentation

## 2013-03-24 DIAGNOSIS — J869 Pyothorax without fistula: Secondary | ICD-10-CM

## 2013-03-24 DIAGNOSIS — K449 Diaphragmatic hernia without obstruction or gangrene: Secondary | ICD-10-CM | POA: Insufficient documentation

## 2013-03-24 LAB — BODY FLUID CELL COUNT WITH DIFFERENTIAL
Lymphs, Fluid: 0 %
Monocyte-Macrophage-Serous Fluid: 4 % — ABNORMAL LOW (ref 50–90)
Neutrophil Count, Fluid: 96 % — ABNORMAL HIGH (ref 0–25)
Other Cells, Fluid: 0 %

## 2013-03-24 LAB — CBC WITH DIFFERENTIAL/PLATELET
Basophils Absolute: 0.1 10*3/uL (ref 0.0–0.1)
Eosinophils Relative: 1 % (ref 0–5)
HCT: 27 % — ABNORMAL LOW (ref 39.0–52.0)
Hemoglobin: 8.7 g/dL — ABNORMAL LOW (ref 13.0–17.0)
Lymphocytes Relative: 18 % (ref 12–46)
Lymphs Abs: 2.2 10*3/uL (ref 0.7–4.0)
MCV: 80.4 fL (ref 78.0–100.0)
Monocytes Absolute: 0.8 10*3/uL (ref 0.1–1.0)
Monocytes Relative: 6 % (ref 3–12)
RDW: 14.8 % (ref 11.5–15.5)
WBC: 12.2 10*3/uL — ABNORMAL HIGH (ref 4.0–10.5)

## 2013-03-24 LAB — PROTEIN, BODY FLUID: Total protein, fluid: 5.2 g/dL

## 2013-03-24 LAB — COMPREHENSIVE METABOLIC PANEL
ALT: 45 U/L (ref 0–53)
Albumin: 3.2 g/dL — ABNORMAL LOW (ref 3.5–5.2)
CO2: 25 mEq/L (ref 19–32)
Calcium: 9.4 mg/dL (ref 8.4–10.5)
Chloride: 105 mEq/L (ref 96–112)
Glucose, Bld: 125 mg/dL — ABNORMAL HIGH (ref 70–99)
Potassium: 5.8 mEq/L — ABNORMAL HIGH (ref 3.5–5.3)
Sodium: 139 mEq/L (ref 135–145)
Total Protein: 6.9 g/dL (ref 6.0–8.3)

## 2013-03-24 LAB — PROTIME-INR
INR: 1.12 (ref <1.50)
Prothrombin Time: 14.4 seconds (ref 11.6–15.2)

## 2013-03-24 LAB — LACTATE DEHYDROGENASE, PLEURAL OR PERITONEAL FLUID

## 2013-03-24 MED ORDER — HYDROCODONE-ACETAMINOPHEN 5-325 MG PO TABS: 1.0000 | ORAL_TABLET | Freq: Four times a day (QID) | ORAL | Status: DC | PRN

## 2013-03-24 NOTE — Progress Notes (Signed)
[facility] FOLLOW UP  RFV: follow up for longterm antibiotics for streptococcal emypyema Subjective:    Patient ID: Alexander Gray, male    DOB: 1961-07-28, 52 y.o.   MRN: [id number]  HPI Alexander Gray is a 52yo M who was hospitalized from 6/24-29 originally evaluated for chest/shoulder pain as an outpatient, found to have an effusion, underwent thoracentesis + empyema, cx + viridans streptococcus and referred to be admitted for management of left sided empyemas/p Left sided VATs-decortication, drainage of empyema by Dr. Lyn Gray.OR cultures identified strep intermedius (PCN 0.032),and no other cultures positive (negative blood cultures). He was placed on ceftriaxone 2gm IV daily for 3 wks to then switch to an oral regimen. He has not missed any doses of antibiotics, however did notice his picc line falling out on 7/11 where he was instructed to go to ED to be replaced. The CXR done on 7/11 shows some reaccumulation of fluid on the left hemithorax. THe patient states that he still has nightsweats, no overt fever, but has chills. He is noticing worsening shortness of breath with going up flight of stairs or ambulating short distances. No orthopnea. No cough. No difficulty with infusion of antibiotics, no pain in left arm with picc line. No diarrhea no rash   Current Outpatient Prescriptions on File Prior to Visit  Medication Sig Dispense Refill  . cholecalciferol (VITAMIN D) 1000 UNITS tablet Take 2,000 Units by mouth daily.      Marland Kitchen dextrose 5 % SOLN 50 mL with cefTRIAXone 2 G SOLR 2 g Inject 2 g into the vein daily.  1 Container  14  . docusate sodium (COLACE) 100 MG capsule Take 100 mg by mouth daily.      Marland Kitchen guaiFENesin (MUCINEX) 600 MG 12 hr tablet Take 1 tablet (600 mg total) by mouth 2 (two) times daily.  30 tablet  0  . Multiple Vitamin (MULTIVITAMIN WITH MINERALS) TABS Take 1 tablet by mouth daily.      . Omega-3 Fatty Acids (FISH OIL) 1200 MG CAPS Take 1,200 mg by mouth daily.      Marland Kitchen  omeprazole (PRILOSEC OTC) 20 MG tablet Take 20 mg by mouth daily.      . vitamin B-12 (CYANOCOBALAMIN) 1000 MCG tablet Take 1,000 mcg by mouth daily.      . vitamin C (ASCORBIC ACID) 500 MG tablet Take 500 mg by mouth daily.      . diphenhydramine-acetaminophen (TYLENOL PM) 25-500 MG TABS Take 1 tablet by mouth at bedtime.       Marland Kitchen tetrahydrozoline 0.05 % ophthalmic solution Place 2 drops into both eyes 2 (two) times daily as needed (for dry eyes).       No current facility-administered medications on file prior to visit.   Active Ambulatory Problems    Diagnosis Date Noted  . S/P thoracentesis   . Gastritis   . Other emphysema 03/02/2013   Resolved Ambulatory Problems    Diagnosis Date Noted  . No Resolved Ambulatory Problems   Past Medical History  Diagnosis Date  . Hemoptysis   . Pleural effusion, left   . Shortness of breath   . Kidney stones   . GERD (gastroesophageal reflux disease)   . Seizures   . Streptococcus viridans infection   . History of alcohol abuse   . S/P tooth extraction    History  Substance Use Topics  . Smoking status: Former Smoker -- 1.00 packs/day for 35 years    Types: Cigarettes    Quit date:  09/30/2012  . Smokeless tobacco: Never Used     Comment: vapor for 6 months  . Alcohol Use: No  family history is not on file.  Review of Systems 12 point ros reviewed, positive and negative pertinents listed in hpi. All other ROS are negative    Objective:   Physical Exam Temp(Src) 98.4 F (36.9 C) (Oral)  Wt 182 lb (82.555 kg)  BMI 26.11 kg/m2 Physical Exam  Constitutional: He is oriented to person, place, and time. He appears well-developed and well-nourished. No distress.  HENT:  Mouth/Throat: Oropharynx is clear and moist. No oropharyngeal exudate.  Cardiovascular: Normal rate, regular rhythm and normal heart sounds. Exam reveals no gallop and no friction rub.  No murmur heard.  Pulmonary/Chest: Effort normal and breath sounds normal on the  right. Decrease breath sounds on the left 1/2 up hemithorax. Decrease to percussion. Chest wall: well healed scars from minithoracotomy Abdominal: Soft. Bowel sounds are normal. He exhibits no distension. There is no tenderness.  Lymphadenopathy:  He has no cervical adenopathy.  Neurological: He is alert and oriented to person, place, and time.  Skin: Skin is warm and dry. No rash noted. No erythema.  Psychiatric: He has a normal mood and affect. His behavior is normal.    Labs: 7/12 /14 shows wbc 12.2 hgt 8.4, plt 891.    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 Lab work still suggests reactive process since plt 891. cxr is worse on 7/11. Will Get US guided thoracentesis attempt for therapeutic draw, will also get chest CT. This plan has been discussed with Dr. Lyn Gray. Will also repeat light's criteria and culture to ensure that not having further growth on culture. Dr. Lyn Gray to look at images to see if further surgical intervention needed.  Anemia = can also contribute to weakness and shortness of breath. Will look to see what his baseline count and see if needs further work up  Continue with ceftriaxone 2-3 wks addn  60 min spent with patient with greater than 50% spent in coordination of care  rtc in 2 wk

## 2013-03-24 NOTE — Procedures (Signed)
Successful US guided Left thoracentesis. Yielded 180 cc of serosanginous fluid. Pt tolerated procedure well. No immediate complications.  Specimen was sent for labs. CT chest was previously ordered by ordering physician post thoracentesis.  Pattricia Boss D PA-C 03/24/2013 2:12 PM

## 2013-03-25 ENCOUNTER — Ambulatory Visit (HOSPITAL_COMMUNITY): Payer: Self-pay

## 2013-03-27 LAB — BODY FLUID CULTURE: Gram Stain: NONE SEEN

## 2013-03-28 ENCOUNTER — Telehealth: Payer: Self-pay | Admitting: *Deleted

## 2013-03-28 NOTE — Telephone Encounter (Signed)
Pt calling, stating he thought he was supposed to have [facility] remove his PICC line today.  Per record review, PICC should stay in place and he should be continuing IV antibiotics through 04/07/13.  Pt did not have 2 week follow up appointment scheduled as requested.  RN scheduled him for 04/05/13 at 3:45. Andree Coss, RN

## 2013-03-29 ENCOUNTER — Telehealth: Payer: Self-pay | Admitting: *Deleted

## 2013-03-29 NOTE — Telephone Encounter (Signed)
Call from Coretta at [facility] regarding IV antibiotics. Verbal order given per Dr. Feliz Beam office note to continue IV antibiotics through 04/07/13. Patient has a follow up appt 04/05/13. Wendall Mola

## 2013-04-04 ENCOUNTER — Other Ambulatory Visit: Payer: Self-pay | Admitting: *Deleted

## 2013-04-04 DIAGNOSIS — J869 Pyothorax without fistula: Secondary | ICD-10-CM

## 2013-04-05 ENCOUNTER — Encounter: Payer: Self-pay | Admitting: Internal Medicine

## 2013-04-05 ENCOUNTER — Other Ambulatory Visit: Payer: Self-pay | Admitting: *Deleted

## 2013-04-05 ENCOUNTER — Ambulatory Visit
Admission: RE | Admit: 2013-04-05 | Discharge: 2013-04-05 | Disposition: A | Discharge status: Home / Self Care | Payer: No Typology Code available for payment source | Source: Ambulatory Visit | Attending: Cardiothoracic Surgery | Admitting: Cardiothoracic Surgery

## 2013-04-05 ENCOUNTER — Encounter: Payer: Self-pay | Admitting: Cardiothoracic Surgery

## 2013-04-05 ENCOUNTER — Ambulatory Visit (INDEPENDENT_AMBULATORY_CARE_PROVIDER_SITE_OTHER): Payer: Self-pay | Admitting: Cardiothoracic Surgery

## 2013-04-05 ENCOUNTER — Ambulatory Visit (INDEPENDENT_AMBULATORY_CARE_PROVIDER_SITE_OTHER): Payer: Self-pay | Admitting: Internal Medicine

## 2013-04-05 VITALS — BP 125/78 | HR 93 | Temp 98.1°F | Wt 183.0 lb

## 2013-04-05 VITALS — BP 123/72 | HR 93 | Resp 16 | Ht 70.0 in | Wt 182.0 lb

## 2013-04-05 DIAGNOSIS — J869 Pyothorax without fistula: Secondary | ICD-10-CM

## 2013-04-05 DIAGNOSIS — J90 Pleural effusion, not elsewhere classified: Secondary | ICD-10-CM

## 2013-04-05 DIAGNOSIS — Z09 Encounter for follow-up examination after completed treatment for conditions other than malignant neoplasm: Secondary | ICD-10-CM

## 2013-04-05 MED ORDER — CEPHALEXIN 500 MG PO CAPS: 500.0000 mg | ORAL_CAPSULE | Freq: Three times a day (TID) | ORAL | Status: DC

## 2013-04-05 MED ORDER — HYDROCODONE-ACETAMINOPHEN 5-325 MG PO TABS: 1.0000 | ORAL_TABLET | Freq: Four times a day (QID) | ORAL | Status: DC | PRN

## 2013-04-05 NOTE — Progress Notes (Signed)
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   MRN: [id number]  HPI Mr. Hoffmann is a 52yo M who was hospitalized from 6/24-29 originally evaluated for chest/shoulder pain as an outpatient, found to have an effusion, underwent thoracentesis + empyema, cx + viridans streptococcus and referred to be admitted for management of left sided empyemas/p Left sided VATs-decortication, drainage of empyema by Dr. Lyn Henri.OR cultures identified strep intermedius (PCN 0.032),and no other cultures positive (negative blood cultures) who has been on cetriaxone x 8wk to stop on July 31st. He is excited to get back home to [place]. Saw CT surgery and felt good with results today. No fever, chills, nightsweats, little bit of a cough. cxr showing improvement still some chronic changes at the left base. No difficulty with picc line infusion  Current Outpatient Prescriptions on File Prior to Visit  Medication Sig Dispense Refill  . cholecalciferol (VITAMIN D) 1000 UNITS tablet Take 2,000 Units by mouth daily.      Marland Kitchen dextrose 5 % SOLN 50 mL with cefTRIAXone 2 G SOLR 2 g Inject 2 g into the vein daily.  1 Container  14  . diphenhydramine-acetaminophen (TYLENOL PM) 25-500 MG TABS Take 1 tablet by mouth at bedtime.       Marland Kitchen guaiFENesin (MUCINEX) 600 MG 12 hr tablet Take 1 tablet (600 mg total) by mouth 2 (two) times daily.  30 tablet  0  . Multiple Vitamin (MULTIVITAMIN WITH MINERALS) TABS Take 1 tablet by mouth daily.      . Omega-3 Fatty Acids (FISH OIL) 1200 MG CAPS Take 1,200 mg by mouth daily.      Marland Kitchen omeprazole (PRILOSEC OTC) 20 MG tablet Take 20 mg by mouth daily.      Marland Kitchen tetrahydrozoline 0.05 % ophthalmic solution Place 2 drops into both eyes 2 (two) times daily as needed (for dry eyes).      . vitamin B-12 (CYANOCOBALAMIN) 1000 MCG tablet Take 1,000 mcg by mouth daily.      . vitamin C (ASCORBIC ACID)  500 MG tablet Take 500 mg by mouth daily.       No current facility-administered medications on file prior to visit.   Active Ambulatory Problems    Diagnosis Date Noted  . S/P thoracentesis   . Gastritis   . Other emphysema 03/02/2013   Resolved Ambulatory Problems    Diagnosis Date Noted  . No Resolved Ambulatory Problems   Past Medical History  Diagnosis Date  . Hemoptysis   . Pleural effusion, left   . Shortness of breath   . Kidney stones   . GERD (gastroesophageal reflux disease)   . Seizures   . Streptococcus viridans infection   . History of alcohol abuse   . S/P tooth extraction    History  Substance Use Topics  . Smoking status: Former Smoker -- 1.00 packs/day for 35 years    Types: Cigarettes    Quit date: 09/30/2012  . Smokeless tobacco: Never Used     Comment: vapor for 6 months  . Alcohol Use: No  family history is not on file.  Review of Systems Review of Systems  Constitutional: Negative for fever, chills, diaphoresis, activity change, appetite change, fatigue and unexpected weight change.  HENT: Negative for congestion, sore throat, rhinorrhea, sneezing, trouble swallowing and sinus pressure.  Eyes: Negative for photophobia and visual disturbance.  Respiratory: Negative for cough,  chest tightness, shortness of breath, wheezing and stridor.  Cardiovascular: Negative for chest pain, palpitations and leg swelling.  Gastrointestinal: Negative for nausea, vomiting, abdominal pain, diarrhea, constipation, blood in stool, abdominal distention and anal bleeding.  Genitourinary: Negative for dysuria, hematuria, flank pain and difficulty urinating.  Musculoskeletal: Negative for myalgias, back pain, joint swelling, arthralgias and gait problem.  Skin: Negative for color change, pallor, rash and wound.  Neurological: Negative for dizziness, tremors, weakness and light-headedness.  Hematological: Negative for adenopathy. Does not bruise/bleed easily.   Psychiatric/Behavioral: Negative for behavioral problems, confusion, sleep disturbance, dysphoric mood, decreased concentration and agitation.       Objective:   Physical Exam  BP 125/78  Pulse 93  Temp(Src) 98.1 F (36.7 C) (Oral)  Wt 183 lb (83.008 kg)  BMI 26.26 kg/m2 Physical Exam  Constitutional: He is oriented to person, place, and time. He appears well-developed and well-nourished. No distress.  HENT:  Mouth/Throat: Oropharynx is clear and moist. No oropharyngeal exudate. Poor dentition Cardiovascular: Normal rate, regular rhythm and normal heart sounds. Exam reveals no gallop and no friction rub.  No murmur heard.  Pulmonary/Chest: Effort normal and breath sounds normal. No respiratory distress. He has no wheezes.  Abdominal: Soft. Bowel sounds are normal. He exhibits no distension. There is no tenderness.  Lymphadenopathy:  He has no cervical adenopathy.  Neurological: He is alert and oriented to person, place, and time.  Skin: Skin is warm and dry. No rash noted. No erythema.  Ext: right picc line c/d/i Psychiatric: He has a normal mood and affect. His behavior is normal.   LABS: Lab Results  Component Value Date   ESRSEDRATE 139* 03/24/2013   Lab Results  Component Value Date   CRP 13.5* 03/24/2013        Assessment & Plan:  Left lung empyema = will get home health to pull picc on 7/31 then have him transition to keflex 500mg  TID until we see him in oct 9th. If he doesn't find a pcp in [place] when he returns back home. Recommend that he should be on oral antibiotics for another month and have his new pcp check sed rate ,crp, and cxr to ensure continued improvement. If normalize and clinically improved, can likely stop antibiotics in 4 wks.  rtc when he gets back to town on oct 9th. Ideally would like him to have new pcp to take over care or coordinate care via phone

## 2013-04-05 NOTE — Progress Notes (Signed)
[address]       [place] [address]             [phone]                  Vasilis Luhman [facility] Medical Record #[id number] Date of Birth: 04/08/1961  Alexander Maudlin, MD Alexander Maudlin, MD  Chief Complaint:   PostOp Follow Up Visit 03/01/2013   OPERATIVE REPORT  PREOPERATIVE DIAGNOSIS: Left empyema.  POSTOPERATIVE DIAGNOSIS: Left empyema.  PROCEDURE: Bronchoscopy, left video-assisted thoracoscopy, drainage of  empyema, and decortication.  SURGEON: Sheliah Plane, MD   History of Present Illness:     Patient returns to the office today now approximately one month postop after drainage of a large left effusion/empyema. Considering the amount of fluid that was present in the left chest the patient was surprisingly asymptomatic when seen preop. On July 17 after being seen in the infectious disease office a thoracentesis was done on the left with approximately 180 mL return of serosanguineous fluid that was culture negative. The patient notes that he's continued to improve since surgery a month ago, the postsurgical pain in the left chest this continued to improve, he's had no fever or chills. Overall he feels much better and wants to return to [place].     History  Smoking status  . Former Smoker -- 1.00 packs/day for 35 years  . Types: Cigarettes  . Quit date: 09/30/2012  Smokeless tobacco  . Never Used    Comment: vapor for 6 months       No Known Allergies  Current Outpatient Prescriptions  Medication Sig Dispense Refill  . cholecalciferol (VITAMIN D) 1000 UNITS tablet Take 2,000 Units by mouth daily.      Marland Kitchen dextrose 5 % SOLN 50 mL with cefTRIAXone 2 G SOLR 2 g Inject 2 g into the vein daily.  1 Container  14  . diphenhydramine-acetaminophen (TYLENOL PM) 25-500 MG TABS Take 1 tablet by mouth at bedtime.       Marland Kitchen guaiFENesin (MUCINEX) 600 MG 12 hr tablet Take 1 tablet (600 mg total) by mouth 2 (two) times daily.  30 tablet  0  .  Multiple Vitamin (MULTIVITAMIN WITH MINERALS) TABS Take 1 tablet by mouth daily.      . Omega-3 Fatty Acids (FISH OIL) 1200 MG CAPS Take 1,200 mg by mouth daily.      Marland Kitchen omeprazole (PRILOSEC OTC) 20 MG tablet Take 20 mg by mouth daily.      Marland Kitchen tetrahydrozoline 0.05 % ophthalmic solution Place 2 drops into both eyes 2 (two) times daily as needed (for dry eyes).      . vitamin B-12 (CYANOCOBALAMIN) 1000 MCG tablet Take 1,000 mcg by mouth daily.      . vitamin C (ASCORBIC ACID) 500 MG tablet Take 500 mg by mouth daily.      Marland Kitchen HYDROcodone-acetaminophen (NORCO) 5-325 MG per tablet Take 1 tablet by mouth every 6 (six) hours as needed for pain.  40 tablet  1   No current facility-administered medications for this visit.       Physical Exam: BP 123/72  Pulse 93  Resp 16  Ht 5\' 10"  (1.778 m)  Wt 182 lb (82.555 kg)  BMI 26.11 kg/m2  SpO2 98%  General appearance: alert, cooperative and appears older than stated age Neurologic: intact Heart: regular rate and rhythm, S1, S2 normal, no murmur, click, rub or gallop and normal apical impulse Lungs: diminished  breath sounds bibasilar Abdomen: soft, non-tender; bowel sounds normal; no masses,  no organomegaly Extremities: extremities normal, atraumatic, no cyanosis or edema and Homans sign is negative, no sign of DVT Wound: the left chest tube sites are all well-healed Wounds:  Diagnostic Studies & Laboratory data:         Recent Radiology Findings: Dg Chest 2 View  04/05/2013   *RADIOLOGY REPORT*  Clinical Data: First left VATS for chronic empyema, chest pain and shortness of breath, follow-up  CHEST - 2 VIEW  Comparison: CT chest of 03/24/2013 and chest x-ray of 03/18/2013  Findings: There has perhaps been very minimal improvement in the pleural and parenchymal opacities at the left lung base most consistent with loculations within a left empyema.  The right lung is clear.  Heart size is stable.  An old healed right posterolateral sixth rib  fracture is noted.  IMPRESSION: Little change to very slight improvement in air-fluid levels at the left lung base most consistent with empyema.   Original Report Authenticated By: Dwyane Dee, M.D.   preop:    Post op today:  Recent Labs: Lab Results  Component Value Date   WBC 12.2* 03/24/2013   HGB 8.7* 03/24/2013   HCT 27.0* 03/24/2013   PLT 821* 03/24/2013   GLUCOSE 125* 03/24/2013   ALT 45 03/24/2013   AST 35 03/24/2013   NA 139 03/24/2013   K 5.8* 03/24/2013   CL 105 03/24/2013   CREATININE 0.71 03/24/2013   BUN 17 03/24/2013   CO2 25 03/24/2013   INR 1.12 03/24/2013      Assessment / Plan:    Patient continues to improve after the drainage of this large left empyema, he still has chronic changes at the left base but markedly improved from preop. He is asymptomatic At this point I recommended to him a followup chest x-ray in 4-6 weeks, he wants to return to [place] but would not return for 8 weeks. I'll plan to see him with a followup chest x-ray in 8 weeks when he returns from [place]. He is going to the infectious disease clinic this afternoon, to discuss the removal of his PICC line and either discontinuation of antibiotics or changing to by mouth.    Alexander Gray B 04/05/2013 3:00 PM

## 2013-04-07 ENCOUNTER — Ambulatory Visit: Payer: Self-pay | Admitting: Cardiothoracic Surgery

## 2013-04-07 ENCOUNTER — Telehealth: Payer: Self-pay | Admitting: *Deleted

## 2013-04-07 LAB — AFB CULTURE WITH SMEAR (NOT AT ARMC)

## 2013-04-07 NOTE — Telephone Encounter (Signed)
RN reviewed Dr. Feliz Beam OV notes from 04/05/13.  PICC removal date for 04/07/13.  [facility] RN verbalized back the date and will remove PICC today.

## 2013-04-13 LAB — AFB CULTURE WITH SMEAR (NOT AT ARMC)
Acid Fast Smear: NONE SEEN
Acid Fast Smear: NONE SEEN

## 2013-04-14 ENCOUNTER — Encounter: Payer: Self-pay | Admitting: Internal Medicine

## 2013-05-26 ENCOUNTER — Ambulatory Visit: Payer: Self-pay | Admitting: Cardiothoracic Surgery

## 2013-06-15 ENCOUNTER — Ambulatory Visit: Payer: Self-pay | Admitting: Internal Medicine

## 2013-06-16 ENCOUNTER — Other Ambulatory Visit: Payer: Self-pay | Admitting: *Deleted

## 2013-06-16 ENCOUNTER — Ambulatory Visit
Admission: RE | Admit: 2013-06-16 | Discharge: 2013-06-16 | Disposition: A | Discharge status: Home / Self Care | Payer: No Typology Code available for payment source | Source: Ambulatory Visit | Attending: Cardiothoracic Surgery | Admitting: Cardiothoracic Surgery

## 2013-06-16 ENCOUNTER — Ambulatory Visit (INDEPENDENT_AMBULATORY_CARE_PROVIDER_SITE_OTHER): Payer: Self-pay | Admitting: Cardiothoracic Surgery

## 2013-06-16 ENCOUNTER — Encounter: Payer: Self-pay | Admitting: Cardiothoracic Surgery

## 2013-06-16 VITALS — BP 111/70 | HR 79 | Resp 20 | Ht 70.0 in | Wt 182.0 lb

## 2013-06-16 DIAGNOSIS — J90 Pleural effusion, not elsewhere classified: Secondary | ICD-10-CM

## 2013-06-16 DIAGNOSIS — J869 Pyothorax without fistula: Secondary | ICD-10-CM

## 2013-06-16 DIAGNOSIS — Z09 Encounter for follow-up examination after completed treatment for conditions other than malignant neoplasm: Secondary | ICD-10-CM

## 2013-06-16 NOTE — Progress Notes (Signed)
[address]       [place] [address]             [phone]                  Abdel Effinger [facility] Medical Record #[id number] Date of Birth: 1960-12-12  Fredirick Maudlin, MD Fredirick Maudlin, MD  Chief Complaint:   PostOp Follow Up Visit 03/01/2013   OPERATIVE REPORT  PREOPERATIVE DIAGNOSIS: Left empyema.  POSTOPERATIVE DIAGNOSIS: Left empyema.  PROCEDURE: Bronchoscopy, left video-assisted thoracoscopy, drainage of  empyema, and decortication.  SURGEON: Sheliah Plane, MD   History of Present Illness:     Patient returns to the office today now approximately 3 months  postop after drainage of a large left effusion/empyema. Considering the amount of fluid that was present in the left chest the patient was surprisingly asymptomatic when seen preop. On July 17 after being seen in the infectious disease office a thoracentesis was done on the left with approximately 180 mL return of serosanguineous fluid that was culture negative. The patient notes that he's continued to improve since surgery a month ago, the postsurgical pain in the left chest this continued to improve, he's had no fever or chills. No cough. He completed antibiotics per ID last week.  He is now back living in [facility].   Patient says he has not had flu or pneumococcal vacination.  History  Smoking status  . Former Smoker -- 1.00 packs/day for 35 years  . Types: Cigarettes  . Quit date: 09/30/2012  Smokeless tobacco  . Never Used    Comment: vapor for 6 months       No Known Allergies  Current Outpatient Prescriptions  Medication Sig Dispense Refill  . cholecalciferol (VITAMIN D) 1000 UNITS tablet Take 2,000 Units by mouth daily.      . diphenhydramine-acetaminophen (TYLENOL PM) 25-500 MG TABS Take 1 tablet by mouth at bedtime.       Marland Kitchen ibuprofen (ADVIL,MOTRIN) 200 MG tablet Take 200 mg by mouth every 6 (six) hours as needed for pain.      . Multiple Vitamin (MULTIVITAMIN WITH  MINERALS) TABS Take 1 tablet by mouth daily.      . Omega-3 Fatty Acids (FISH OIL) 1200 MG CAPS Take 1,200 mg by mouth daily.      Marland Kitchen omeprazole (PRILOSEC OTC) 20 MG tablet Take 20 mg by mouth daily.      . vitamin B-12 (CYANOCOBALAMIN) 1000 MCG tablet Take 1,000 mcg by mouth daily.      . vitamin C (ASCORBIC ACID) 500 MG tablet Take 500 mg by mouth daily.       No current facility-administered medications for this visit.       Physical Exam: BP 111/70  Pulse 79  Resp 20  Ht 5\' 10"  (1.778 m)  Wt 182 lb (82.555 kg)  BMI 26.11 kg/m2  SpO2 97%  General appearance: alert, cooperative and appears older than stated age Neurologic: intact Heart: regular rate and rhythm, S1, S2 normal, no murmur, click, rub or gallop and normal apical impulse Lungs: diminished breath sounds bibasilar Abdomen: soft, non-tender; bowel sounds normal; no masses,  no organomegaly Extremities: extremities normal, atraumatic, no cyanosis or edema and Homans sign is negative, no sign of DVT Wound: the left chest tube sites are all well-healed   Diagnostic Studies & Laboratory data:         Recent Radiology Findings: Dg Chest 2 View  06/16/2013  CLINICAL DATA:  52 year old male with history of left lung surgery. Empyema.  EXAM: CHEST  2 VIEW  COMPARISON:  04/05/2013 and earlier.  FINDINGS: Interval improved left lung base ventilation. There remains increased pleural based opacity, but the previously seen multiple small air-fluid levels do not persist. No pneumothorax.  Stable cardiac and mediastinal contours. Visualized tracheal air column is within normal limits.  Chronic right upper rib fractures. Subtle increased patchy opacity in the right midlung on the frontal view. Elsewhere the right lung appears stable and clear.  IMPRESSION: 1. Improved left lung base ventilation and resolved small air-fluid levels, although left lower pleural based opacity persists.  2. Subtle patchy increased opacity in the right  midlung. Mild/early respiratory infection not excluded. Attention directed on followup.   Electronically Signed   By: Augusto Gamble M.D.   On: 06/16/2013 11:35      Recent Labs: Lab Results  Component Value Date   WBC 12.2* 03/24/2013   HGB 8.7* 03/24/2013   HCT 27.0* 03/24/2013   PLT 821* 03/24/2013   GLUCOSE 125* 03/24/2013   ALT 45 03/24/2013   AST 35 03/24/2013   NA 139 03/24/2013   K 5.8* 03/24/2013   CL 105 03/24/2013   CREATININE 0.71 03/24/2013   BUN 17 03/24/2013   CO2 25 03/24/2013   INR 1.12 03/24/2013      Assessment / Plan:    Patient continues to improve after the drainage of this large left empyema, he still has chronic changes at the left base but markedly improved from preop. He is asymptomatic At this point I recommended to him a followup chest x-ray in 4-6 months . He has  returned to [place].  He is going to the infectious disease clinic this Monday. I have recommended he discuss with ID getting flu and pneumococcal vaccination Will see back in 4-6 months when he again return from [facility].   Adley Castello B 06/16/2013 12:33 PM

## 2013-06-20 ENCOUNTER — Ambulatory Visit (INDEPENDENT_AMBULATORY_CARE_PROVIDER_SITE_OTHER): Payer: Self-pay | Admitting: Internal Medicine

## 2013-06-20 ENCOUNTER — Encounter: Payer: Self-pay | Admitting: Internal Medicine

## 2013-06-20 VITALS — BP 131/81 | HR 81 | Temp 97.5°F | Wt 200.0 lb

## 2013-06-20 DIAGNOSIS — Z23 Encounter for immunization: Secondary | ICD-10-CM

## 2013-06-20 NOTE — Progress Notes (Signed)
[facility] CLINIC NOTE  RFV: hospital follow up Subjective:    Patient ID: Alexander Gray, male    DOB: [birth year], 52 y.o.   MRN: [id number]  HPI Alexander Gray is a 51yo M who was hospitalized from 6/24-29 for complicated left sided empyema with effusion s/p left sided VASTS-decortication, drainage by Alexander Gray. cx + viridans streptococcus and  identified strep intermedius (PCN 0.032),and no other cultures positive (negative blood cultures) who has been on cetriaxone x 8wk to stop on July 31st, then placed on oral antibiotics until 06/16/2013. He is doing well without ay complaints of pulmonary symptoms. Occasionally has discomfort on left side with deep inspiration. He has been back home to [place]. Saw CT surgery and felt good with results today. No fever, chills, nightsweats, little bit of a cough. cxr showing improvement still some chronic changes at the left base at last visit.  Current Outpatient Prescriptions on File Prior to Visit  Medication Sig Dispense Refill  . cholecalciferol (VITAMIN D) 1000 UNITS tablet Take 2,000 Units by mouth daily.      . diphenhydramine-acetaminophen (TYLENOL PM) 25-500 MG TABS Take 1 tablet by mouth at bedtime.       Marland Kitchen ibuprofen (ADVIL,MOTRIN) 200 MG tablet Take 200 mg by mouth every 6 (six) hours as needed for pain.      . Multiple Vitamin (MULTIVITAMIN WITH MINERALS) TABS Take 1 tablet by mouth daily.      . Omega-3 Fatty Acids (FISH OIL) 1200 MG CAPS Take 1,200 mg by mouth daily.      Marland Kitchen omeprazole (PRILOSEC OTC) 20 MG tablet Take 20 mg by mouth daily.      . vitamin B-12 (CYANOCOBALAMIN) 1000 MCG tablet Take 1,000 mcg by mouth daily.      . vitamin C (ASCORBIC ACID) 500 MG tablet Take 500 mg by mouth daily.       No current facility-administered medications on file prior to visit.   Active Ambulatory Problems    Diagnosis Date Noted  . S/P thoracentesis   . Gastritis   . Other emphysema 03/02/2013   Resolved Ambulatory Problems    Diagnosis Date  Noted  . No Resolved Ambulatory Problems   Past Medical History  Diagnosis Date  . Hemoptysis   . Pleural effusion, left   . Shortness of breath   . Kidney stones   . GERD (gastroesophageal reflux disease)   . Seizures   . Streptococcus viridans infection   . History of alcohol abuse   . S/P tooth extraction    Soc hx: stopped smoking since June 2014     Review of Systems  Constitutional: Negative for fever, chills, diaphoresis, activity change, appetite change, fatigue and unexpected weight change.  HENT: Negative for congestion, sore throat, rhinorrhea, sneezing, trouble swallowing and sinus pressure.  Eyes: Negative for photophobia and visual disturbance.  Respiratory: rare mild discomfort with deep inspiration on left side. Negative for cough, chest tightness, shortness of breath, wheezing and stridor.  Cardiovascular: Negative for chest pain, palpitations and leg swelling.  Gastrointestinal: Negative for nausea, vomiting, abdominal pain, diarrhea, constipation, blood in stool, abdominal distention and anal bleeding.  Genitourinary: Negative for dysuria, hematuria, flank pain and difficulty urinating.  Musculoskeletal: Negative for myalgias, back pain, joint swelling, arthralgias and gait problem.  Skin: Negative for color change, pallor, rash and wound.  Neurological: Negative for dizziness, tremors, weakness and light-headedness.  Hematological: Negative for adenopathy. Does not bruise/bleed easily.  Psychiatric/Behavioral: Negative for behavioral problems, confusion, sleep disturbance, dysphoric mood,  decreased concentration and agitation.       Objective:   Physical Exam BP 131/81  Pulse 81  Temp(Src) 97.5 F (36.4 C) (Oral)  Wt 200 lb (90.719 kg)  BMI 28.7 kg/m2 Physical Exam  Constitutional: He is oriented to person, place, and time. He appears well-developed and well-nourished. No distress.  HENT: poor dentition Mouth/Throat: Oropharynx is clear and moist.  No oropharyngeal exudate.  Cardiovascular: Normal rate, regular rhythm and normal heart sounds. Exam reveals no gallop and no friction rub.  No murmur heard.  Pulmonary/Chest: Effort normal and breath sounds normal. No respiratory distress. He has no wheezes. Decrease breath sounds at left base Lymphadenopathy:  no cervical adenopathy.  Neurological: He is alert and oriented to person, place, and time.  Skin: Skin is warm and dry. No rash noted. No erythema.  Psychiatric: He has a normal mood and affect. His behavior is normal.       Assessment & Plan:  Strep viridans empyema = had prolonged course of antibiotics to treat empyema. No longer needs antibiotics. Doing well.  Health promotion = will give pneumonia (due to DM), and influenza vaccination today  rtc prn

## 2013-10-27 ENCOUNTER — Ambulatory Visit: Payer: Self-pay | Admitting: Cardiothoracic Surgery

## 2013-12-12 ENCOUNTER — Other Ambulatory Visit: Payer: Self-pay | Admitting: *Deleted

## 2013-12-12 DIAGNOSIS — J869 Pyothorax without fistula: Secondary | ICD-10-CM

## 2013-12-15 ENCOUNTER — Ambulatory Visit: Payer: Self-pay | Admitting: Cardiothoracic Surgery

## 2013-12-15 NOTE — Progress Notes (Deleted)
[facility] Medical Record #[id number] Date of Birth: 08/09/1961  Alexander Bogus, MD Alexander Bogus, MD  Chief Complaint:   PostOp Follow Up Visit 03/01/2013   OPERATIVE REPORT  PREOPERATIVE DIAGNOSIS: Left empyema.  POSTOPERATIVE DIAGNOSIS: Left empyema.  PROCEDURE: Bronchoscopy, left video-assisted thoracoscopy, drainage of  empyema, and decortication.  SURGEON: Lanelle Bal, MD   History of Present Illness:     Patient returns to the office today now approximately 3 months  postop after drainage of a large left effusion/empyema. Considering the amount of fluid that was present in the left chest the patient was surprisingly asymptomatic when seen preop. On July 17 after being seen in the infectious disease office a thoracentesis was done on the left with approximately 180 mL return of serosanguineous fluid that was culture negative. The patient notes that he's continued to improve since surgery a month ago, the postsurgical pain in the left chest this continued to improve, he's had no fever or chills. No cough.  Patient says he has not had flu or pneumococcal vacination.  History  Smoking status  . Former Smoker -- 1.00 packs/day for 35 years  . Types: Cigarettes  . Quit date: 09/30/2012  Smokeless tobacco  . Never Used    Comment: vapor for 6 months       No Known Allergies  Current Outpatient Prescriptions  Medication Sig Dispense Refill  . cholecalciferol (VITAMIN D) 1000 UNITS tablet Take 2,000 Units by mouth daily.      . diphenhydramine-acetaminophen (TYLENOL PM) 25-500 MG TABS Take 1 tablet by mouth at bedtime.       Marland Kitchen ibuprofen (ADVIL,MOTRIN) 200 MG tablet Take 200 mg by mouth every 6 (six) hours as needed for pain.      . Multiple Vitamin (MULTIVITAMIN WITH MINERALS) TABS Take 1 tablet by mouth daily.      . Omega-3 Fatty Acids (FISH OIL) 1200 MG CAPS Take 1,200 mg by mouth daily.      Marland Kitchen omeprazole (PRILOSEC OTC)  20 MG tablet Take 20 mg by mouth daily.      . vitamin B-12 (CYANOCOBALAMIN) 1000 MCG tablet Take 1,000 mcg by mouth daily.      . vitamin C (ASCORBIC ACID) 500 MG tablet Take 500 mg by mouth daily.       No current facility-administered medications for this visit.       Physical Exam: There were no vitals taken for this visit.  General appearance: alert, cooperative and appears older than stated age Neurologic: intact Heart: regular rate and rhythm, S1, S2 normal, no murmur, click, rub or gallop and normal apical impulse Lungs: diminished breath sounds bibasilar Abdomen: soft, non-tender; bowel sounds normal; no masses,  no organomegaly Extremities: extremities normal, atraumatic, no cyanosis or edema and Homans sign is negative, no sign of DVT Wound: the left chest tube sites are all well-healed   Diagnostic Studies & Laboratory data:         Recent Radiology Findings: No results found.    Recent Labs: Lab Results  Component Value Date   WBC 12.2* 03/24/2013   HGB 8.7* 03/24/2013   HCT 27.0* 03/24/2013   PLT 821* 03/24/2013   GLUCOSE 125* 03/24/2013   ALT 45 03/24/2013   AST 35 03/24/2013   NA 139 03/24/2013   K 5.8* 03/24/2013   CL 105 03/24/2013   CREATININE 0.71 03/24/2013  BUN 17 03/24/2013   CO2 25 03/24/2013   INR 1.12 03/24/2013      Assessment / Plan:      Alexander Gray 12/15/2013 12:02 PM

## 2013-12-15 NOTE — Progress Notes (Signed)
This encounter was created in error - please disregard.

## 2015-02-22 IMAGING — CT CT ABD-PELV W/ CM
3 of 11 series · 9 of 46 positions shown, 14 images · IV contrast (Omnipaque 300)
Comparison: None.
COMPARISON: None.

CLINICAL DATA: Chest pain.  Abdominal pain.

CT ANGIOGRAPHY CHEST WITH CONTRAST
TECHNIQUE: Multidetector CT imaging of the chest was performed
using the standard protocol during bolus administration of
intravenous contrast.  Multiplanar CT image reconstructions
including MIPs were obtained to evaluate the vascular anatomy.
Contrast: 50mL OMNIPAQUE IOHEXOL 300 MG/ML  SOLN, 100mL OMNIPAQUE
IOHEXOL 350 MG/ML SOLN
TECHNIQUE: Multidetector CT imaging of the abdomen and pelvis was
performed using the standard protocol following bolus
administration of intravenous contrast.

[Series 4: pe 3.0 b40f · axial · 0.69mm/px · z∈[-161,+43]mm · 5 of 104 slices shown]
[im 18/104  soft-tissue]
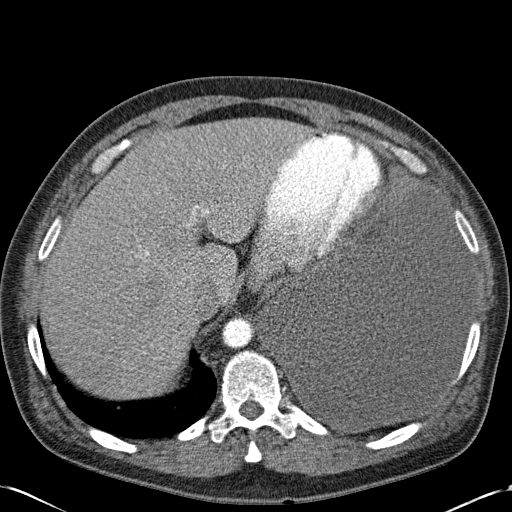
[im 35/104  soft-tissue]
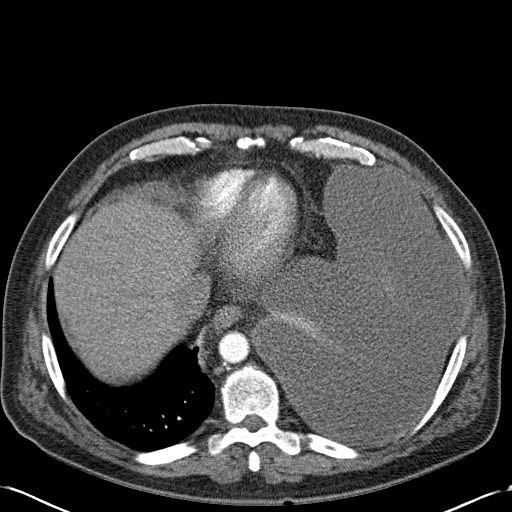
[im 52/104  soft-tissue]
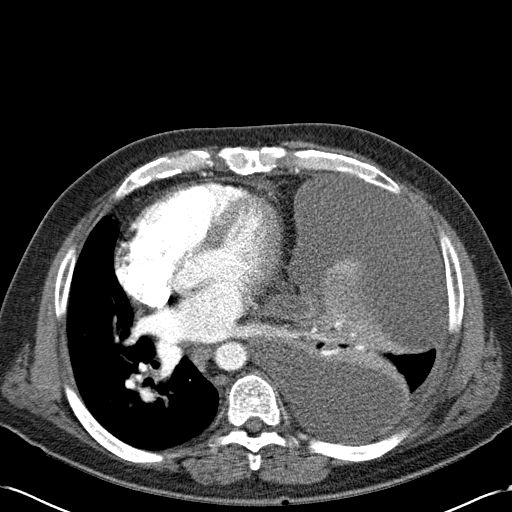
[im 69/104  soft-tissue]
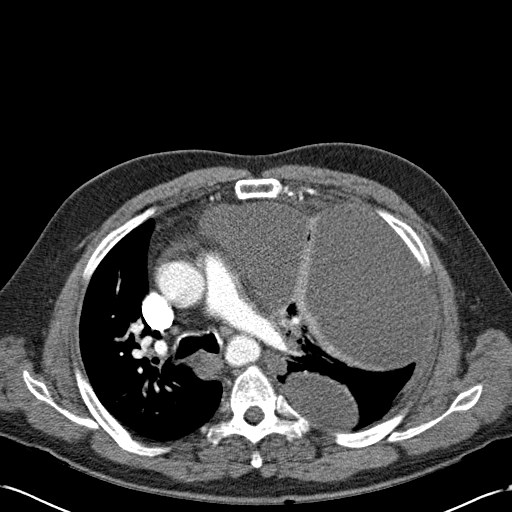
[im 86/104  soft-tissue]
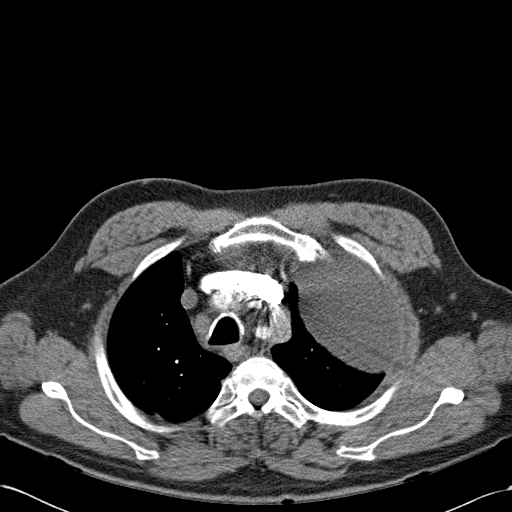

[Series 6: mpr coronal pe cor · coronal · 0.61mm/px · 1 of 97 slices shown, 2 images]
[im 49/97  soft-tissue]
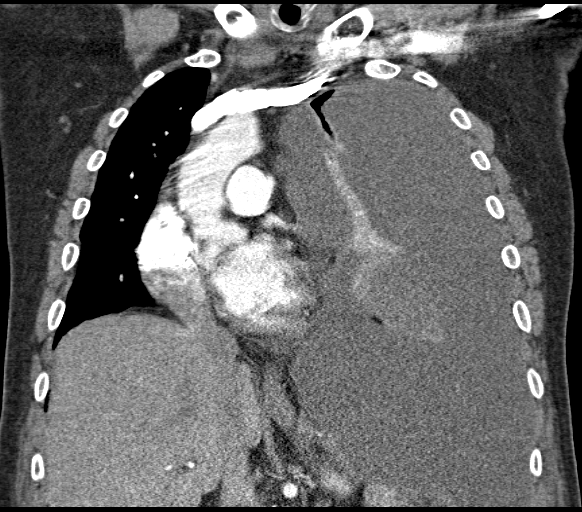
[im 49/97  bone]
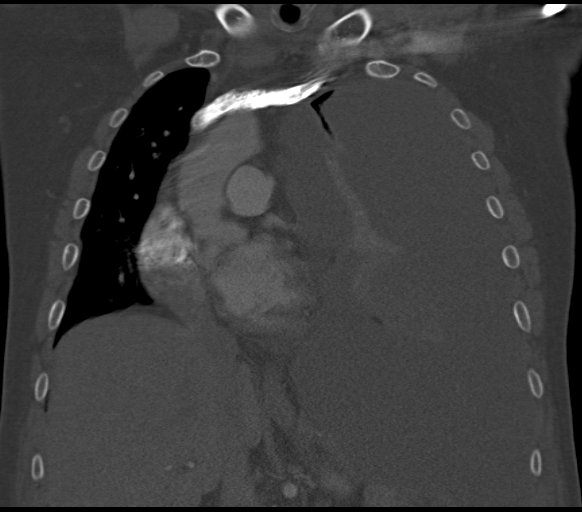

[Series 10: abd_pel 5.0 b40f · axial · 0.69mm/px · z∈[-440,-200]mm · 3 of 98 slices shown, 7 images]
[im 25/98  soft-tissue]
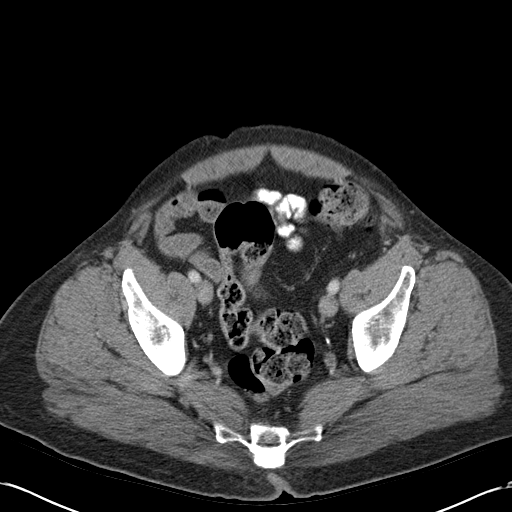
[im 25/98  lung]
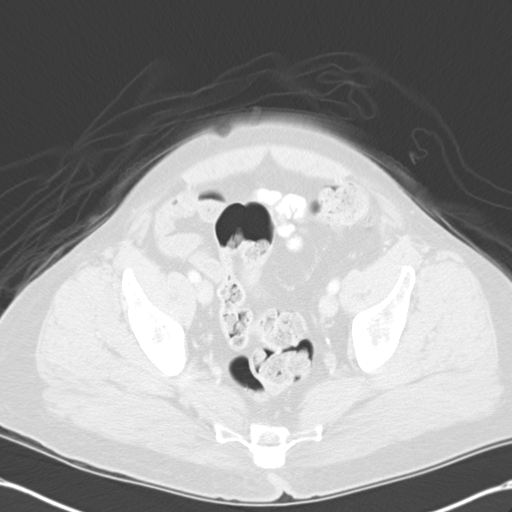
[im 25/98  bone]
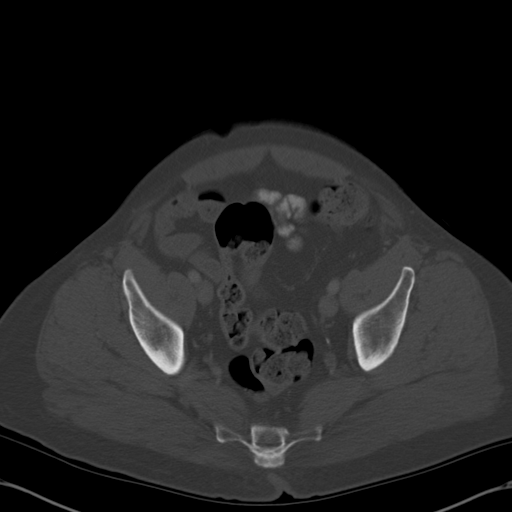
[im 49/98  soft-tissue]
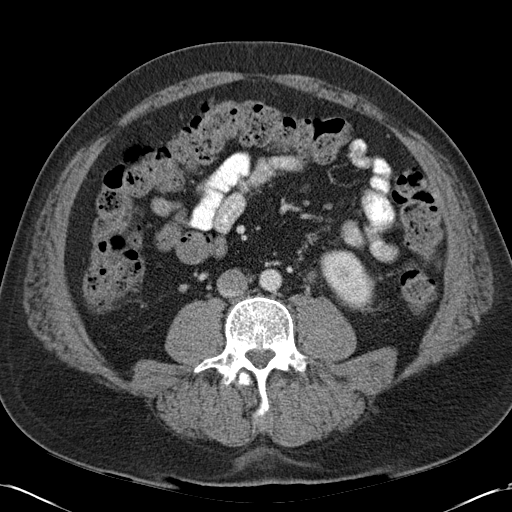
[im 49/98  lung]
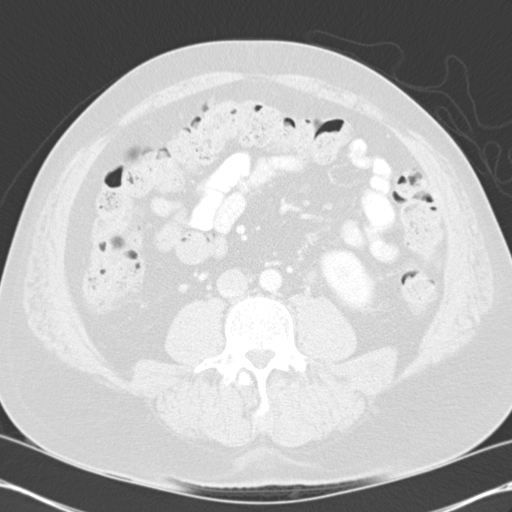
[im 73/98  soft-tissue]
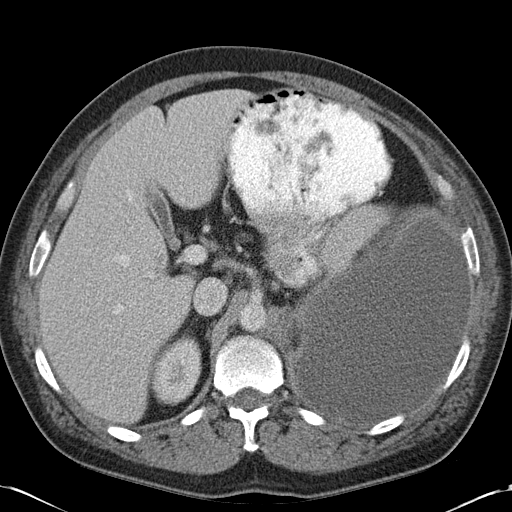
[im 73/98  lung]
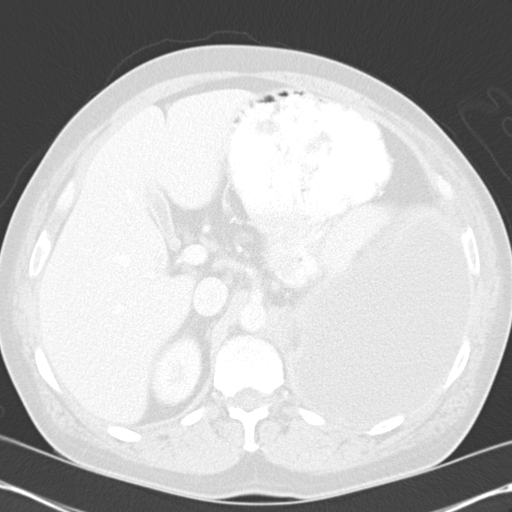

[9 of 46 positions shown; findings below may reference images not displayed]

FINDINGS: There are no filling defects in the pulmonary arterial
tree to suggest acute pulmonary thromboembolism.

There is a large loculated left pleural effusion causing
compressive atelectasis have nearly the entire left lung.  The
parietal pleura surrounding the left pleural effusion is slightly
thickened and enhances posteriorly compatible with complicated
pleural fluid.  There is shift of the mediastinum to the right
secondary to mass effect from the loculated pleural effusion.

11 mm short axis diameter right paratracheal node on image 20.
Small prevascular nodes.  11 mm short axis left hilar node adjacent
to the left mainstem bronchus and inferior on image 47. Other
smaller mediastinal nodes are noted.

No obvious endobronchial lesion in the left mainstem bronchus.
There is narrowing of the tertiary segment bronchus to the lateral
basal segment of the left lower lobe on image 47.  The anterior
basal segment tertiary bronchus is difficult to visualize and may
be occluded.

There are two areas of ground-glass at the right apex which are
nonspecific.  They measure 9 and 10 mm.

Chronic right-sided rib deformity with healing.  No definite acute
rib fractures.  No vertebral compression deformity.

Review of the MIP images confirms the above findings.
IMPRESSION: No evidence of acute pulmonary thromboembolism.

Large complicated left pleural effusion exerting mass effect,
mediastinum and shift to the right.  Considerations include an
inflammatory process such as empyema or malignant pleural effusion.

Left lower lobe tertiary segment areas are abnormal with areas of
narrowing and/or occlusion.  Underlying endobronchial lesion or
mucous plugging cannot be excluded.  Bronchoscopy may be helpful.

Abnormal mediastinal adenopathy.  Given the other findings,
malignancy or inflammatory process are not excluded.

Nonspecific ground-glass at the right lung apex. Adenocarcinoma
cannot be excluded.  Followup by CT is recommended in 12 months,
with continued annual surveillance for a minimum of 3 years.

These recommendations are taken from "Recommendations for the
Management of Subsolid Pulmonary Nodules Detected at CT:  A
317.

CT ABDOMEN AND PELVIS WITH CONTRAST
FINDINGS: The gallbladder is decompressed.

The liver, spleen, pancreas, adrenal glands, and kidneys are within
normal limits.

The larger locular left pleural effusion exerts mass effect upon
the hemidiaphragm causing downward displacement.  This could be
contributing to left upper quadrant pain.

Moderate stool burden throughout the colon without evidence of
obstruction or focal wall thickening.

Normal appendix.

Bladder and prostate are within normal limits.

Mild atherosclerotic changes of the aorta.

No destructive bony process.
IMPRESSION: No acute intra-abdominal pathology.

The large loculated left pleural effusion exerts mass effect upon
the left hemidiaphragm with downward displacement.  This could be
contributing to left upper quadrant pain.

## 2015-02-24 IMAGING — US US THORACENTESIS ASP PLEURAL SPACE W/IMG GUIDE
1 series · 3 of 3 positions shown · non-contrast
Comparison: none

CLINICAL DATA: Large left pleural effusion, shortness of breath

ULTRASOUND GUIDED LEFT THORACENTESIS:
TECHNIQUE: After explanation of procedure and risks, written informed consent
for thoracentesis obtained.
Time out protocol followed.
Left pleural effusion localized by ultrasound.
Site at posterior leftchest prepped and draped in usual sterile
fashion.
Skin and chest wall anesthetized with 6.5 ml of 1% lidocaine.
Standard 8-French thoracentesis catheter placed into left pleural
space.
Poor fluid flow was encountered.
Catheter was removed and a 5-French Yueh needle was placed into
left hemithorax at the same site.
Good flow of fluid was encountered.
2331 ml of serosanguineous fluid aspirated by syringe pump.
Procedure tolerated well by patient without immediate complication.

[Series 1: us thoracentesis asp pleural space w/img guide · 0.27mm/px · 3 of 3 slices shown]
[im 1/3]
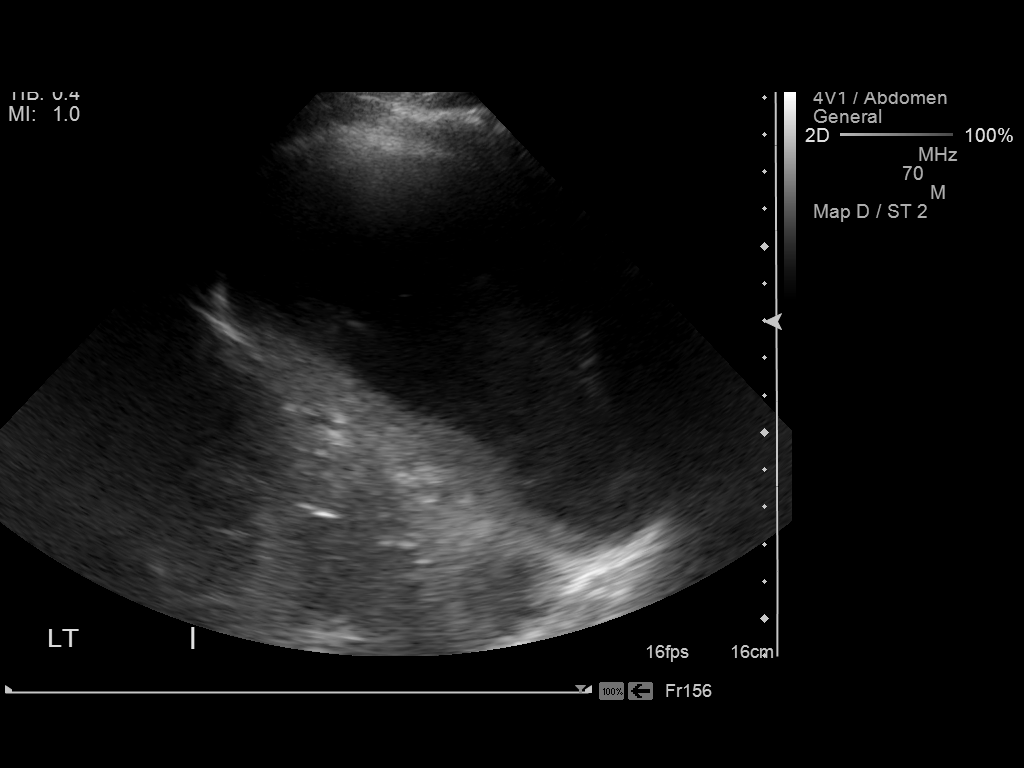
[im 2/3]
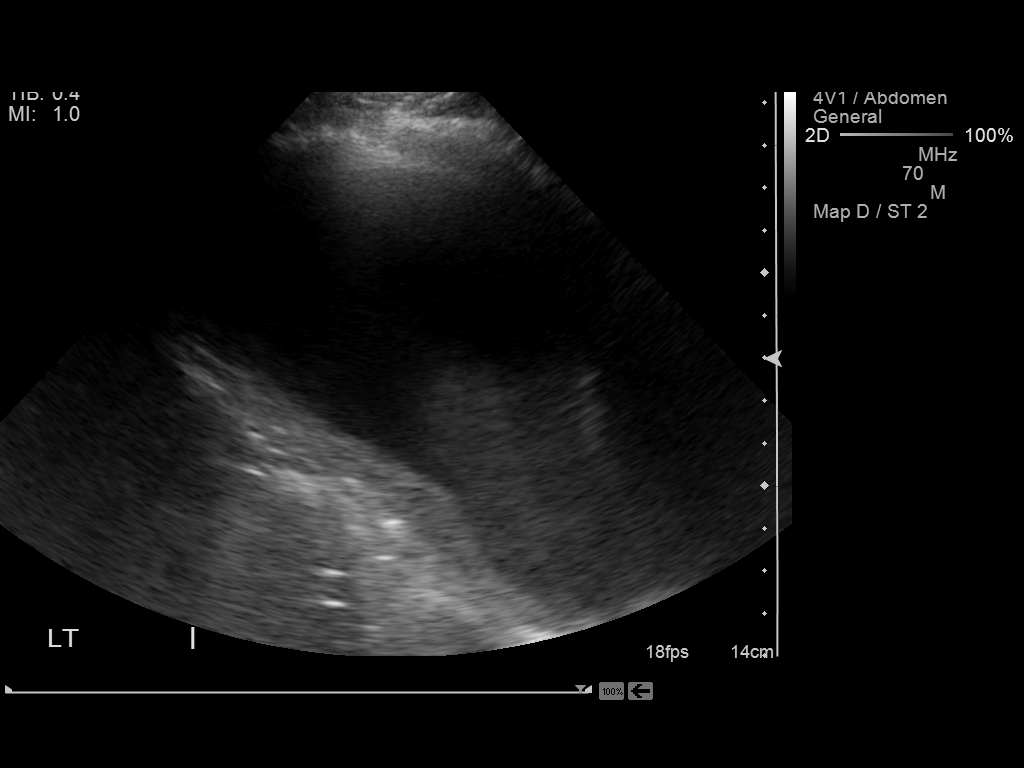
[im 3/3]
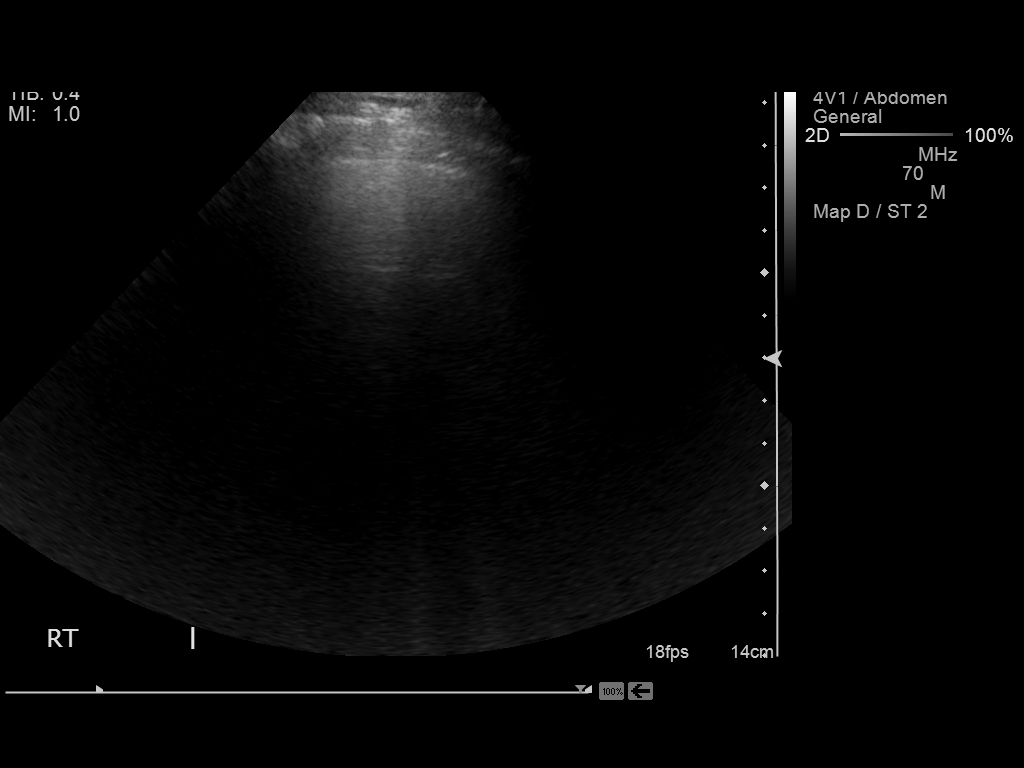

[3 of 3 positions shown; findings below may reference images not displayed]

IMPRESSION: Ultrasound-guided left thoracentesis with removal of 2331 ml of
left pleural fluid.
Fluid sent to laboratory for requested analysis.

## 2022-05-09 DEATH — deceased
